# Patient Record
Sex: Female | Born: 1985 | Race: White | Hispanic: No | Marital: Married | Smoking: Never smoker
Health system: Southern US, Community
[De-identification: ages and names within clinical notes are randomized; demographics above are authoritative.]

## PROBLEM LIST (undated history)

## (undated) DIAGNOSIS — N9089 Other specified noninflammatory disorders of vulva and perineum: Secondary | ICD-10-CM

## (undated) DIAGNOSIS — R87619 Unspecified abnormal cytological findings in specimens from cervix uteri: Secondary | ICD-10-CM

## (undated) DIAGNOSIS — IMO0002 Reserved for concepts with insufficient information to code with codable children: Secondary | ICD-10-CM

## (undated) DIAGNOSIS — Z8619 Personal history of other infectious and parasitic diseases: Secondary | ICD-10-CM

## (undated) HISTORY — PX: KNEE SURGERY: SHX244

## (undated) HISTORY — DX: Personal history of other infectious and parasitic diseases: Z86.19

## (undated) HISTORY — DX: Other specified noninflammatory disorders of vulva and perineum: N90.89

## (undated) HISTORY — DX: Reserved for concepts with insufficient information to code with codable children: IMO0002

## (undated) HISTORY — DX: Unspecified abnormal cytological findings in specimens from cervix uteri: R87.619

---

## 2011-04-02 ENCOUNTER — Emergency Department (HOSPITAL_COMMUNITY): Payer: BC Managed Care – PPO

## 2011-04-02 ENCOUNTER — Emergency Department (HOSPITAL_COMMUNITY)
Admission: EM | Admit: 2011-04-02 | Discharge: 2011-04-03 | Disposition: A | Discharge status: Home / Self Care | Payer: BC Managed Care – PPO | Attending: Emergency Medicine | Admitting: Emergency Medicine

## 2011-04-02 DIAGNOSIS — X500XXA Overexertion from strenuous movement or load, initial encounter: Secondary | ICD-10-CM | POA: Insufficient documentation

## 2011-04-02 DIAGNOSIS — M25539 Pain in unspecified wrist: Secondary | ICD-10-CM | POA: Insufficient documentation

## 2011-04-02 DIAGNOSIS — Y9364 Activity, baseball: Secondary | ICD-10-CM | POA: Insufficient documentation

## 2011-04-02 DIAGNOSIS — Y92838 Other recreation area as the place of occurrence of the external cause: Secondary | ICD-10-CM | POA: Insufficient documentation

## 2011-04-02 DIAGNOSIS — Y9239 Other specified sports and athletic area as the place of occurrence of the external cause: Secondary | ICD-10-CM | POA: Insufficient documentation

## 2011-04-02 DIAGNOSIS — S93409A Sprain of unspecified ligament of unspecified ankle, initial encounter: Secondary | ICD-10-CM | POA: Insufficient documentation

## 2012-10-25 ENCOUNTER — Telehealth: Payer: Self-pay | Admitting: Obstetrics and Gynecology

## 2012-10-26 ENCOUNTER — Encounter: Payer: Self-pay | Admitting: Obstetrics and Gynecology

## 2012-10-26 ENCOUNTER — Ambulatory Visit (INDEPENDENT_AMBULATORY_CARE_PROVIDER_SITE_OTHER): Payer: BC Managed Care – PPO | Admitting: Obstetrics and Gynecology

## 2012-10-26 VITALS — BP 110/64 | HR 74 | Resp 14 | Ht 64.5 in | Wt 131.0 lb

## 2012-10-26 DIAGNOSIS — N6459 Other signs and symptoms in breast: Secondary | ICD-10-CM

## 2012-10-26 DIAGNOSIS — N6452 Nipple discharge: Secondary | ICD-10-CM

## 2012-10-26 DIAGNOSIS — Z8619 Personal history of other infectious and parasitic diseases: Secondary | ICD-10-CM | POA: Insufficient documentation

## 2012-10-26 DIAGNOSIS — Z124 Encounter for screening for malignant neoplasm of cervix: Secondary | ICD-10-CM

## 2012-10-26 DIAGNOSIS — Z01419 Encounter for gynecological examination (general) (routine) without abnormal findings: Secondary | ICD-10-CM

## 2012-10-26 DIAGNOSIS — IMO0002 Reserved for concepts with insufficient information to code with codable children: Secondary | ICD-10-CM | POA: Insufficient documentation

## 2012-10-26 DIAGNOSIS — N9089 Other specified noninflammatory disorders of vulva and perineum: Secondary | ICD-10-CM | POA: Insufficient documentation

## 2012-10-26 NOTE — Progress Notes (Signed)
Annual Exam:    Subjective:    Kelsey [name] is a 26 y.o. female, G0P0, who presents for an annual exam.  Last period was a few days late.  Pt has had several episodes of expressable milky substance from right breast only.  Last Pap: 04/21/2011 WNL: ASC-US  Regular Periods:yes last period was 4 days late pt is unsure why Contraception: None  Monthly Breast exam:no Tetanus<35yrs:yes Nl.Bladder Function:yes Daily BMs:yes Healthy Diet:yes Calcium:no Mammogram:no Date of Mammogram: N/A Exercise:yes Have often Exercise: Crossfit 3 days a wek  Seatbelt: yes Abuse at home: no Stressful work:no Sigmoid-colonoscopy: N/A  Bone Density: No PCP: No PCP Change in PMH: No Changes Change in FMH:No Changes    History   Social History  . Marital Status: Single    Spouse Name: N/A    Number of Children: N/A  . Years of Education: N/A   Social History Main Topics  . Smoking status: Never Smoker   . Smokeless tobacco: Never Used  . Alcohol Use: Yes     Comment: socially   . Drug Use: No  . Sexually Active: Yes -- Female partner(s)    Birth Control/ Protection: None   Other Topics Concern  . None   Social History Narrative  . None    Menstrual cycle:   LMP: Patient's last menstrual period was 10/07/2012.           Cycle: usually q28 days.  More recently 3-4 days late.  No IM bleeding.  No cramps  The following portions of the patient's history were reviewed and updated as appropriate: allergies, current medications, past family history, past medical history, past social history, past surgical history and problem list.  Review of Systems Pertinent items are noted in HPI. Breast:Negative for breast lump,nipple discharge or nipple retraction Gastrointestinal: Negative for abdominal pain, change in bowel habits or rectal bleeding Urinary:negative   Objective:    BP 110/64  Pulse 74  Resp 14  Ht 5' 4.5" (1.638 m)  Wt 131 lb (59.421 kg)  BMI 22.14 kg/m2  LMP 10/07/2012     Weight:  Wt Readings from Last 1 Encounters:  10/26/12 131 lb (59.421 kg)          BMI: Body mass index is 22.14 kg/(m^2).  General Appearance: Alert, appropriate appearance for age. No acute distress HEENT: Grossly normal Neck / Thyroid: Supple, no masses, nodes or enlargement Lungs: clear to auscultation bilaterally Back: No CVA tenderness Breast Exam: No masses.  expressable milky discharge from the right. Cardiovascular: Regular rate and rhythm. S1, S2, no murmur Gastrointestinal: Soft, non-tender, no masses or organomegaly Pelvic Exam: Vulva and vagina appear normal. Bimanual exam reveals normal uterus and adnexa. Rectovaginal: normal rectal, no masses Lymphatic Exam: Non-palpable nodes in neck, clavicular, axillary, or inguinal regions Skin: no rash or abnormalities Neurologic: Normal gait and speech, no tremor  Psychiatric: Alert and oriented, appropriate affect.   Wet Prep:not applicable Urinalysis:not applicable UPT: Not done   Assessment:    Mild menstrual irregularity  Expressable right breast galactorrhea Hx HPV   Plan:    pap smear Pt to call in two weeks for breast pap results.  If neg, pt to come in for prolactin, TSH and HGB STD screening: DONE Contraception:NONE.  Will need to consider   Dierdre Forth MD

## 2012-10-28 LAB — PAP IG, CT-NG, RFX HPV ASCU: GC Probe Amp: NEGATIVE

## 2012-10-28 LAB — CYTOLOGY - NON PAP

## 2012-11-01 ENCOUNTER — Ambulatory Visit: Payer: BC Managed Care – PPO | Admitting: Obstetrics and Gynecology

## 2012-11-17 ENCOUNTER — Ambulatory Visit: Payer: BC Managed Care – PPO | Admitting: Obstetrics and Gynecology

## 2012-11-17 ENCOUNTER — Encounter: Payer: Self-pay | Admitting: Obstetrics and Gynecology

## 2012-11-17 VITALS — BP 100/60 | Wt 133.0 lb

## 2012-11-17 DIAGNOSIS — Z8619 Personal history of other infectious and parasitic diseases: Secondary | ICD-10-CM

## 2012-11-17 DIAGNOSIS — IMO0002 Reserved for concepts with insufficient information to code with codable children: Secondary | ICD-10-CM

## 2012-11-17 DIAGNOSIS — N643 Galactorrhea not associated with childbirth: Secondary | ICD-10-CM

## 2012-11-17 DIAGNOSIS — N926 Irregular menstruation, unspecified: Secondary | ICD-10-CM

## 2012-11-17 DIAGNOSIS — R87619 Unspecified abnormal cytological findings in specimens from cervix uteri: Secondary | ICD-10-CM

## 2012-11-17 LAB — HEMOGLOBIN: Hemoglobin: 13.4 g/dL (ref 12.0–15.0)

## 2012-11-17 NOTE — Progress Notes (Signed)
Patient ID: Kelsey [name], female   DOB: [birth year], 27 y.o.   MRN: [id number]  Kelsey [name] is a 27 y.o. female.   HPI: Presents for evaluation of abnormal Pap smear, and followup of expressable right breast discharge.  Pap from breast discharge showed no malignant cells   Indications: Pap smear on December 2013 showed: glandular cell abnormality (AGUS). Previous colposcopy: normal exam without visible pathology  in February 2013. Prior cervical treatment: no treatment.  Past Medical History  Diagnosis Date  . Abnormal pap     ASC-US   . Vulvar lesion   . History of HPV infection     History reviewed. No pertinent past surgical history.  Family History  Problem Relation Age of Onset  . Hyperlipidemia Father   . Hypertension Father   . Cancer Maternal Grandfather   . Arthritis Paternal Grandmother     Social History History  Substance Use Topics  . Smoking status: Never Smoker   . Smokeless tobacco: Never Used  . Alcohol Use: Yes     Comment: socially     No Known Allergies  Current Outpatient Prescriptions  Medication Sig Dispense Refill  . OVER THE COUNTER MEDICATION Pt takes green vibrance        Review of Systems History of the right wrist discharge with a normal breast pap Last menstrual period was slightly later than normal Blood pressure 100/60, weight 133 lb (60.328 kg), last menstrual period 10/31/2012.  Physical Exam: COLPO ONLY  Data Reviewed PRIOR PAP SMEARS AND COLPOS  12/2006  PAP  Nl 03/2008  PAP  ASCUS POSITIVE HR HPV 04/2008  COLPO CIN I, NEG ECC, PERIANAL CONDYLOMATA 05/2008  GARDASIL COMPLETED 11/2008 07/2008  PAP  ASCUS POSITIVE HR HPV 11/2008  PAP  NL 03/2009  PAP  NL 05/2010  PAP  LGSIL 07/2010  COLPO BX=LGSIL  ECC=NOT DONE 10/2010 PAP  LGSIL 04/2011  PAP  ASCUS NEG HR HPV 10/2011 PAP  GLANDULAR ATYPICAL CELLS NOS 12/2011  COLPO NO LESIONS SEEN.  ECC NEG 10/2012 PAP  AGUS FAVOR NEOPLASTIC    Procedure Details  The risks and benefits of the  procedure and Written informed consent obtained.  Speculum placed in vagina and excellent visualization of cervix achieved, cervix swabbed x 3 with acetic acid solution.Marland Kitchen FINDINGS:  4 O'CLOCK ? ABNORMAL VESSELS ECC AND Endometrial biopsy per protocol  Specimens:  Cervical biopsy 4:00.   Endocervical, curettage.   Endometrial biopsy  Complications: none.  Assessment   AGUS, favor neoplastic   Galactorrhea    Irregular menses   Plan  TSH, PROLACTIN, HGB  Pathology and Specimens labelled and sent to Pathology. Return to discuss Pathology results in 2 weeks.      Violanda Bobeck P 11/18/2012, 12:04 PM Previous Pap Smear: 10/26/12  EPITHELIAL CELL ABNORMALITY: GLANDULAR CELLS ATYPICAL GLANDULAR CELLS ARE PRESENT, FAVOR NEOPLASTIC PROCESS (AGC/AGUS).  Previous Colposcopy:  Referred From: n/a LMP: 10/31/12 Contraception: none G,P: 0, 0

## 2012-11-18 LAB — TSH: TSH: 2.356 u[IU]/mL (ref 0.350–4.500)

## 2012-11-26 ENCOUNTER — Telehealth: Payer: Self-pay | Admitting: Obstetrics and Gynecology

## 2012-11-29 ENCOUNTER — Other Ambulatory Visit: Payer: Self-pay

## 2012-11-29 DIAGNOSIS — N926 Irregular menstruation, unspecified: Secondary | ICD-10-CM

## 2012-11-29 NOTE — Telephone Encounter (Signed)
Lm on vm to cb per telephone call.  

## 2012-12-22 ENCOUNTER — Encounter: Payer: Self-pay | Admitting: Obstetrics and Gynecology

## 2012-12-22 ENCOUNTER — Other Ambulatory Visit: Payer: Self-pay | Admitting: Obstetrics and Gynecology

## 2012-12-22 ENCOUNTER — Ambulatory Visit: Payer: BC Managed Care – PPO

## 2012-12-22 ENCOUNTER — Ambulatory Visit: Payer: BC Managed Care – PPO | Admitting: Obstetrics and Gynecology

## 2012-12-22 VITALS — BP 90/56 | Wt 132.0 lb

## 2012-12-22 DIAGNOSIS — N926 Irregular menstruation, unspecified: Secondary | ICD-10-CM

## 2012-12-22 DIAGNOSIS — Z8619 Personal history of other infectious and parasitic diseases: Secondary | ICD-10-CM

## 2012-12-22 DIAGNOSIS — IMO0002 Reserved for concepts with insufficient information to code with codable children: Secondary | ICD-10-CM

## 2012-12-22 NOTE — Progress Notes (Signed)
GYN FOLLOWUP PROBLEM VISIT  HX: Irreg menses AGUS pap galactorrhea  1) blood work is normal (TSH, prolactin) 2) biopsy shows only low grade changes 3) endometrial biopsy is normal     Subjective: Menses fairly regular over last few mos  Objective:  BP 90/56  Wt 132 lb (59.875 kg)  BMI 22.32 kg/m2  LMP 11/28/2012   ULTRASOUND: Uterus: Length: 6.81 cm   Width:  6.65 cm   Height:  5.40 cm  Endo thickness:  14 mm in luteal phase Left ovary:Normal Right ovary:Normal Fibroids:no CDS fluid: no free fluid   Comment: Retroflexed uterus. No uterine masses. Endometrium-no focal lesions identified. Normal cavity shape by 3D images. Left adnexa: Simple, paraovarian cyst seen medial to LTOV. Measures 1.4 cm x 1.1 cm x 0.93 cm   Assessment: AGUS pap with LGSIL of cervix on workup No adnexal masses  Plan:  Return to office 6mos for pap   Dierdre Forth, MD  12/22/2012 5:05 PM

## 2014-11-03 NOTE — L&D Delivery Note (Signed)
Delivery Note At 3:54 AM, on August 16, 2015, a viable female, "Kelsey [name]" was delivered via Vaginal, Spontaneous Delivery (Presentation: Left Occiput Anterior with compound right hand and restitution to LOP). After delivery of head, restitution revealed compound hand, which was grasped by provider allowing for uncomplicated delivery of arm and anterior shoulder. Posterior shoulder and chest then delivered and mother encouraged to grasp infant and bring to abdomen.  Infant with good tone and spontaneous cry. Tactile stimulation given by nurse and infant APGAR: 9, 9.  Cord clamped, cut, and blood collected. Placenta delivered, via massage/expression, after ~35 minutes.  After inspection of placenta, amniotic sac appeared missing and vaginal inspection revealed trailing membranes that was teased out with ring forceps; appeared to be in its entirety.  Vaginal inspection revealed no lacerations.  Fundus firm, at U/-2, and bleeding small.  Mother hemodynamically stable and infant skin to skin prior to provider exit.  Mother desires condoms for birth control method and opts to breastfeed.  Mother also desires to take placenta home for encapsulation and provider states that this is okay, but patient informed that she does have chorioamnionitis and should consult with encapsulation specialist prior to ingesting.  Infant weight at one hour of life: 7lbs 12.9oz  Anesthesia: Epidural  Episiotomy: None Lacerations:  None Suture Repair: None Est. Blood Loss (mL): 680 Placenta: Home with patient-Release signed  Mom to postpartum.  Baby to Couplet care / Skin to Skin.  Gerrald Basu LYNN MSN, CNM 08/16/2015, 5:00 AM

## 2014-12-21 LAB — OB RESULTS CONSOLE GC/CHLAMYDIA
Chlamydia: NEGATIVE
Gonorrhea: NEGATIVE

## 2014-12-21 LAB — OB RESULTS CONSOLE ABO/RH: RH TYPE: POSITIVE

## 2014-12-21 LAB — OB RESULTS CONSOLE ANTIBODY SCREEN: ANTIBODY SCREEN: NEGATIVE

## 2014-12-21 LAB — OB RESULTS CONSOLE HIV ANTIBODY (ROUTINE TESTING): HIV: NONREACTIVE

## 2014-12-21 LAB — OB RESULTS CONSOLE HEPATITIS B SURFACE ANTIGEN: HEP B S AG: NEGATIVE

## 2014-12-21 LAB — OB RESULTS CONSOLE RUBELLA ANTIBODY, IGM: Rubella: IMMUNE

## 2015-05-17 LAB — OB RESULTS CONSOLE RPR: RPR: NONREACTIVE

## 2015-07-25 LAB — OB RESULTS CONSOLE GBS: GBS: NEGATIVE

## 2015-07-31 ENCOUNTER — Inpatient Hospital Stay (HOSPITAL_COMMUNITY): Admission: RE | Admit: 2015-07-31 | Payer: Self-pay

## 2015-08-13 ENCOUNTER — Inpatient Hospital Stay (HOSPITAL_COMMUNITY)
Admission: AD | Admit: 2015-08-13 | Discharge: 2015-08-13 | Disposition: A | Discharge status: Home / Self Care | Payer: BLUE CROSS/BLUE SHIELD | Source: Ambulatory Visit | Attending: Obstetrics and Gynecology | Admitting: Obstetrics and Gynecology

## 2015-08-13 ENCOUNTER — Encounter (HOSPITAL_COMMUNITY): Payer: Self-pay | Admitting: *Deleted

## 2015-08-13 DIAGNOSIS — N856 Intrauterine synechiae: Secondary | ICD-10-CM

## 2015-08-13 DIAGNOSIS — O4290 Premature rupture of membranes, unspecified as to length of time between rupture and onset of labor, unspecified weeks of gestation: Secondary | ICD-10-CM

## 2015-08-13 DIAGNOSIS — Z6832 Body mass index (BMI) 32.0-32.9, adult: Secondary | ICD-10-CM

## 2015-08-13 LAB — POCT FERN TEST
POCT Fern Test: NEGATIVE
POCT Fern Test: POSITIVE

## 2015-08-13 LAB — AMNISURE RUPTURE OF MEMBRANE (ROM) NOT AT ARMC: Amnisure ROM: POSITIVE

## 2015-08-13 NOTE — MAU Provider Note (Signed)
History  Kelsey [name] is a 29 yo G1P0 @ 39.3 wks as dated by an 8.0 wk sono (EDD 08/17/15) presents to MAU w/ c/o leakage of clear fluid since 6:15 PM today (08/13/2015).   Pt called previous on call provider and reported having wet undies after her [employer] training work out, but denied leakage of fluid at the time of her call. She was instructed to observe closely, ambulate, and call back with an update.   Patient called back at 7:10 PM reporting leakage of fluid. She also voiced that her baby became very active after eating a banana. I advised her to come to MAU for a formal evaluation. She desired to come in after she ate dinner. Reports light spotting and new onset of cramping.  Pt arrived to MAU, accompanied by FOB, with birth plan in hand. The plan outlined her wishes for expectant management and no interventions, i.e., IV. Couple's ultimate goal is to undergo a WB delivery. Class attended and consents were obtained. Pt shared w/ both RN and me that if she was indeed ruptured, her plan was to return home and await spontaneous labor. This provider was not aware of couple's plan to leave the hospital if + ROM. Couple states lives within 5 minutes of the hospital and can be relied upon to return to the hospital when appropriate.   Reports GBS neg status.     Patient Active Problem List   Diagnosis Date Noted  . PROM (premature rupture of membranes) 08/13/2015  . Abnormal pap   . Vulvar lesion   . History of HPV infection     No chief complaint on file.  HPI As above OB History    Gravida Para Term Preterm AB TAB SAB Ectopic Multiple Living   1 0              Past Medical History  Diagnosis Date  . Abnormal pap     ASC-US   . Vulvar lesion   . History of HPV infection     Past Surgical History  Procedure Laterality Date  . Knee surgery      Family History  Problem Relation Age of Onset  . Hyperlipidemia Father   . Hypertension Father   . Cancer Maternal Grandfather    . Arthritis Paternal Grandmother     Social History  Substance Use Topics  . Smoking status: Never Smoker   . Smokeless tobacco: Never Used  . Alcohol Use: Yes     Comment: socially not while pregnant    Allergies: No Known Allergies  No prescriptions prior to admission    ROS  +LOF +FM -Ctxs -VB Physical Exam   Results for orders placed or performed during the hospital encounter of 08/13/15 (from the past 24 hour(s))  Amnisure rupture of membrane (rom)not at [facility]     Status: None   Collection Time: 08/13/15  9:00 PM  Result Value Ref Range   Amnisure ROM POSITIVE   Fern Test     Status: None   Collection Time: 08/13/15  9:08 PM  Result Value Ref Range   POCT Fern Test Negative = intact amniotic membranes   Fern Test     Status: None   Collection Time: 08/13/15 10:03 PM  Result Value Ref Range   POCT Fern Test Positive = ruptured amniotic membanes    Blood pressure 125/73, pulse 87, temperature 97.6 F (36.4 C), temperature source Oral, resp. rate 16, height 5\' 4"  (1.626 m), weight 85.276 kg (  188 lb).    Physical Exam Gen: Pleasant, NAD Lungs: CTAB CV: RRR w/o M/R/G Abdomen: gravid, soft, NT, no guarding or rebound Pelvic: Blind swab for fern assessment initially obtained by RN; results neg. SSE subsequently performed by me with +pooling and +valsalva noted. A second fern sample was obtained. Although +valsalva and pooling were sufficient enough to denote ROM, decision was made to also perform AmniSure, with results positive for both Cvx: 1/50/-3, +bloody show Ext: WNL FHRT: Marked variability, +accels, no decels  Ctxs: Few ED Course  Assessment: IUP at 39.3 wks PROM at 6:15 pm on 08/13/2015 -- no concerns for infection at this time  Averse to intervention -- chooses trial of expectant management 1st stage labor GBS neg as of 07/25/2015 Desires WB; attended class and consents signed [place]: Reviewed risks of expectant management to  include 1) fetal demise, 2) infection, 3) cord prolapse, 4) cord compression, 5) abruption, 6) PPH, 7) neonatal sepsis, 8) need for antibiotics before delivery, 9) rapid labor & delivery (at home), and 10) potential for c-section. Couple verbalized understanding of the above risks and desires to press forward w/ their original plan. Informed couple that as my advice was to stay to be induced, signing out AMA was indicated. Implications of signing out AMA were reviewed w/ the couple, who again, verbalized understanding.  Dr. Simona Huh contacted re: couple's wishes and suggested pt be seen in the office tomorrow morning. Will have office contact pt for routine OB appt/f/u. Bleeding/labor precautions. Daily fetal kick counts. Pelvic rest. Advised to immediately report change in color or odor of vaginal discharge.   I spent approximately 20 minutes with this patient with over 50% of time spent in face-to-face counseling.      Farrel Gordon CNM, MS 08/13/2015, 9:21 PM

## 2015-08-13 NOTE — Discharge Instructions (Signed)
Fetal Movement Counts  Patient Name: __________________________________________________ Patient Due Date: ____________________  Performing a fetal movement count is highly recommended in high-risk pregnancies, but it is good for every pregnant woman to do. Your health care provider may ask you to start counting fetal movements at 28 weeks of the pregnancy. Fetal movements often increase:  · After eating a full meal.  · After physical activity.  · After eating or drinking something sweet or cold.  · At rest.  Pay attention to when you feel the baby is most active. This will help you notice a pattern of your baby's sleep and wake cycles and what factors contribute to an increase in fetal movement. It is important to perform a fetal movement count at the same time each day when your baby is normally most active.   HOW TO COUNT FETAL MOVEMENTS  1. Find a quiet and comfortable area to sit or lie down on your left side. Lying on your left side provides the best blood and oxygen circulation to your baby.  2. Write down the day and time on a sheet of paper or in a journal.  3. Start counting kicks, flutters, swishes, rolls, or jabs in a 2-hour period. You should feel at least 10 movements within 2 hours.  4. If you do not feel 10 movements in 2 hours, wait 2-3 hours and count again. Look for a change in the pattern or not enough counts in 2 hours.  SEEK MEDICAL CARE IF:  · You feel less than 10 counts in 2 hours, tried twice.  · There is no movement in over an hour.  · The pattern is changing or taking longer each day to reach 10 counts in 2 hours.  · You feel the baby is not moving as he or she usually does.  Date: ____________ Movements: ____________ Start time: ____________ Finish time: ____________   Date: ____________ Movements: ____________ Start time: ____________ Finish time: ____________  Date: ____________ Movements: ____________ Start time: ____________ Finish time: ____________  Date: ____________ Movements:  ____________ Start time: ____________ Finish time: ____________  Date: ____________ Movements: ____________ Start time: ____________ Finish time: ____________  Date: ____________ Movements: ____________ Start time: ____________ Finish time: ____________  Date: ____________ Movements: ____________ Start time: ____________ Finish time: ____________  Date: ____________ Movements: ____________ Start time: ____________ Finish time: ____________   Date: ____________ Movements: ____________ Start time: ____________ Finish time: ____________  Date: ____________ Movements: ____________ Start time: ____________ Finish time: ____________  Date: ____________ Movements: ____________ Start time: ____________ Finish time: ____________  Date: ____________ Movements: ____________ Start time: ____________ Finish time: ____________  Date: ____________ Movements: ____________ Start time: ____________ Finish time: ____________  Date: ____________ Movements: ____________ Start time: ____________ Finish time: ____________  Date: ____________ Movements: ____________ Start time: ____________ Finish time: ____________   Date: ____________ Movements: ____________ Start time: ____________ Finish time: ____________  Date: ____________ Movements: ____________ Start time: ____________ Finish time: ____________  Date: ____________ Movements: ____________ Start time: ____________ Finish time: ____________  Date: ____________ Movements: ____________ Start time: ____________ Finish time: ____________  Date: ____________ Movements: ____________ Start time: ____________ Finish time: ____________  Date: ____________ Movements: ____________ Start time: ____________ Finish time: ____________  Date: ____________ Movements: ____________ Start time: ____________ Finish time: ____________   Date: ____________ Movements: ____________ Start time: ____________ Finish time: ____________  Date: ____________ Movements: ____________ Start time: ____________ Finish  time: ____________  Date: ____________ Movements: ____________ Start time: ____________ Finish time: ____________  Date: ____________ Movements: ____________ Start time:   ____________ Finish time: ____________  Date: ____________ Movements: ____________ Start time: ____________ Finish time: ____________  Date: ____________ Movements: ____________ Start time: ____________ Finish time: ____________  Date: ____________ Movements: ____________ Start time: ____________ Finish time: ____________   Date: ____________ Movements: ____________ Start time: ____________ Finish time: ____________  Date: ____________ Movements: ____________ Start time: ____________ Finish time: ____________  Date: ____________ Movements: ____________ Start time: ____________ Finish time: ____________  Date: ____________ Movements: ____________ Start time: ____________ Finish time: ____________  Date: ____________ Movements: ____________ Start time: ____________ Finish time: ____________  Date: ____________ Movements: ____________ Start time: ____________ Finish time: ____________  Date: ____________ Movements: ____________ Start time: ____________ Finish time: ____________   Date: ____________ Movements: ____________ Start time: ____________ Finish time: ____________  Date: ____________ Movements: ____________ Start time: ____________ Finish time: ____________  Date: ____________ Movements: ____________ Start time: ____________ Finish time: ____________  Date: ____________ Movements: ____________ Start time: ____________ Finish time: ____________  Date: ____________ Movements: ____________ Start time: ____________ Finish time: ____________  Date: ____________ Movements: ____________ Start time: ____________ Finish time: ____________  Date: ____________ Movements: ____________ Start time: ____________ Finish time: ____________   Date: ____________ Movements: ____________ Start time: ____________ Finish time: ____________  Date: ____________  Movements: ____________ Start time: ____________ Finish time: ____________  Date: ____________ Movements: ____________ Start time: ____________ Finish time: ____________  Date: ____________ Movements: ____________ Start time: ____________ Finish time: ____________  Date: ____________ Movements: ____________ Start time: ____________ Finish time: ____________  Date: ____________ Movements: ____________ Start time: ____________ Finish time: ____________  Date: ____________ Movements: ____________ Start time: ____________ Finish time: ____________   Date: ____________ Movements: ____________ Start time: ____________ Finish time: ____________  Date: ____________ Movements: ____________ Start time: ____________ Finish time: ____________  Date: ____________ Movements: ____________ Start time: ____________ Finish time: ____________  Date: ____________ Movements: ____________ Start time: ____________ Finish time: ____________  Date: ____________ Movements: ____________ Start time: ____________ Finish time: ____________  Date: ____________ Movements: ____________ Start time: ____________ Finish time: ____________     This information is not intended to replace advice given to you by your health care provider. Make sure you discuss any questions you have with your health care provider.     Document Released: 11/19/2006 Document Revised: 11/10/2014 Document Reviewed: 08/16/2012  [employer] Interactive Patient Education ©2016 [employer]

## 2015-08-14 DIAGNOSIS — N856 Intrauterine synechiae: Secondary | ICD-10-CM

## 2015-08-14 DIAGNOSIS — Z6832 Body mass index (BMI) 32.0-32.9, adult: Secondary | ICD-10-CM

## 2015-08-15 ENCOUNTER — Inpatient Hospital Stay (HOSPITAL_COMMUNITY)
Admission: AD | Admit: 2015-08-15 | Discharge: 2015-08-18 | DRG: 775 | Disposition: A | Discharge status: Home / Self Care | Payer: BLUE CROSS/BLUE SHIELD | Source: Ambulatory Visit | Attending: Obstetrics and Gynecology | Admitting: Obstetrics and Gynecology

## 2015-08-15 ENCOUNTER — Encounter (HOSPITAL_COMMUNITY): Payer: Self-pay

## 2015-08-15 DIAGNOSIS — O4292 Full-term premature rupture of membranes, unspecified as to length of time between rupture and onset of labor: Principal | ICD-10-CM | POA: Diagnosis present

## 2015-08-15 DIAGNOSIS — O411230 Chorioamnionitis, third trimester, not applicable or unspecified: Secondary | ICD-10-CM | POA: Diagnosis present

## 2015-08-15 DIAGNOSIS — O4290 Premature rupture of membranes, unspecified as to length of time between rupture and onset of labor, unspecified weeks of gestation: Secondary | ICD-10-CM | POA: Diagnosis present

## 2015-08-15 DIAGNOSIS — Z8249 Family history of ischemic heart disease and other diseases of the circulatory system: Secondary | ICD-10-CM | POA: Diagnosis not present

## 2015-08-15 DIAGNOSIS — Z3A39 39 weeks gestation of pregnancy: Secondary | ICD-10-CM

## 2015-08-15 DIAGNOSIS — O34533 Maternal care for retroversion of gravid uterus, third trimester: Secondary | ICD-10-CM | POA: Diagnosis present

## 2015-08-15 DIAGNOSIS — Z8261 Family history of arthritis: Secondary | ICD-10-CM | POA: Diagnosis not present

## 2015-08-15 LAB — CBC
HCT: 37.1 % (ref 36.0–46.0)
Hemoglobin: 12.8 g/dL (ref 12.0–15.0)
MCH: 30.6 pg (ref 26.0–34.0)
MCHC: 34.5 g/dL (ref 30.0–36.0)
MCV: 88.8 fL (ref 78.0–100.0)
PLATELETS: 157 10*3/uL (ref 150–400)
RBC: 4.18 MIL/uL (ref 3.87–5.11)
RDW: 13.8 % (ref 11.5–15.5)
WBC: 22.2 10*3/uL — ABNORMAL HIGH (ref 4.0–10.5)

## 2015-08-15 LAB — RAPID HIV SCREEN (HIV 1/2 AB+AG)
HIV 1/2 Antibodies: NONREACTIVE
HIV-1 P24 ANTIGEN - HIV24: NONREACTIVE

## 2015-08-15 LAB — TYPE AND SCREEN
ABO/RH(D): B POS
Antibody Screen: NEGATIVE

## 2015-08-15 LAB — RPR: RPR: NONREACTIVE

## 2015-08-15 LAB — ABO/RH: ABO/RH(D): B POS

## 2015-08-15 MED ORDER — LIDOCAINE HCL (PF) 1 % IJ SOLN
30.0000 mL | INTRAMUSCULAR | Status: DC | PRN
  Filled 2015-08-15: qty 30

## 2015-08-15 MED ORDER — LACTATED RINGERS IV SOLN
500.0000 mL | INTRAVENOUS | Status: DC | PRN
  Administered 2015-08-15: 500 mL via INTRAVENOUS
  Administered 2015-08-15: 1000 mL via INTRAVENOUS

## 2015-08-15 MED ORDER — PHENYLEPHRINE 40 MCG/ML (10ML) SYRINGE FOR IV PUSH (FOR BLOOD PRESSURE SUPPORT)
80.0000 ug | PREFILLED_SYRINGE | INTRAVENOUS | Status: DC | PRN
  Filled 2015-08-15: qty 20

## 2015-08-15 MED ORDER — OXYCODONE-ACETAMINOPHEN 5-325 MG PO TABS
1.0000 | ORAL_TABLET | ORAL | Status: DC | PRN
Start: 2015-08-15 — End: 2015-08-16

## 2015-08-15 MED ORDER — FENTANYL CITRATE (PF) 100 MCG/2ML IJ SOLN
100.0000 ug | INTRAMUSCULAR | Status: DC | PRN
  Administered 2015-08-15: 100 ug via INTRAVENOUS
  Filled 2015-08-15: qty 2

## 2015-08-15 MED ORDER — TERBUTALINE SULFATE 1 MG/ML IJ SOLN: 0.2500 mg | Freq: Once | INTRAMUSCULAR | Status: DC | PRN

## 2015-08-15 MED ORDER — OXYTOCIN BOLUS FROM INFUSION
500.0000 mL | INTRAVENOUS | Status: DC
Start: 2015-08-15 — End: 2015-08-16
  Administered 2015-08-16: 500 mL via INTRAVENOUS

## 2015-08-15 MED ORDER — SODIUM CHLORIDE 0.9 % IV SOLN: 250.0000 mL | INTRAVENOUS | Status: DC | PRN

## 2015-08-15 MED ORDER — OXYTOCIN 40 UNITS IN LACTATED RINGERS INFUSION - SIMPLE MED
62.5000 mL/h | INTRAVENOUS | Status: DC
Start: 2015-08-15 — End: 2015-08-16

## 2015-08-15 MED ORDER — OXYCODONE-ACETAMINOPHEN 5-325 MG PO TABS: 2.0000 | ORAL_TABLET | ORAL | Status: DC | PRN

## 2015-08-15 MED ORDER — SODIUM CHLORIDE 0.9 % IV SOLN
3.0000 g | Freq: Four times a day (QID) | INTRAVENOUS | Status: AC
  Administered 2015-08-15 – 2015-08-16 (×4): 3 g via INTRAVENOUS
  Filled 2015-08-15 (×4): qty 3

## 2015-08-15 MED ORDER — FENTANYL 2.5 MCG/ML BUPIVACAINE 1/10 % EPIDURAL INFUSION (WH - ANES)
14.0000 mL/h | INTRAMUSCULAR | Status: DC | PRN
  Administered 2015-08-16: 14 mL/h via EPIDURAL
  Filled 2015-08-15: qty 125

## 2015-08-15 MED ORDER — EPHEDRINE 5 MG/ML INJ: 10.0000 mg | INTRAVENOUS | Status: DC | PRN

## 2015-08-15 MED ORDER — FENTANYL CITRATE (PF) 100 MCG/2ML IJ SOLN: 50.0000 ug | INTRAMUSCULAR | Status: DC | PRN

## 2015-08-15 MED ORDER — OXYTOCIN 40 UNITS IN LACTATED RINGERS INFUSION - SIMPLE MED
1.0000 m[IU]/min | INTRAVENOUS | Status: DC
  Administered 2015-08-15: 1 m[IU]/min via INTRAVENOUS
  Administered 2015-08-16: 2 m[IU]/min via INTRAVENOUS
  Filled 2015-08-15: qty 1000

## 2015-08-15 MED ORDER — CITRIC ACID-SODIUM CITRATE 334-500 MG/5ML PO SOLN: 30.0000 mL | ORAL | Status: DC | PRN

## 2015-08-15 MED ORDER — DIPHENHYDRAMINE HCL 50 MG/ML IJ SOLN: 12.5000 mg | INTRAMUSCULAR | Status: DC | PRN

## 2015-08-15 MED ORDER — LACTATED RINGERS IV SOLN
INTRAVENOUS | Status: DC
  Administered 2015-08-15: via INTRAVENOUS

## 2015-08-15 MED ORDER — ACETAMINOPHEN 325 MG PO TABS: 650.0000 mg | ORAL_TABLET | ORAL | Status: DC | PRN

## 2015-08-15 MED ORDER — ONDANSETRON HCL 4 MG/2ML IJ SOLN: 4.0000 mg | Freq: Four times a day (QID) | INTRAMUSCULAR | Status: DC | PRN

## 2015-08-15 NOTE — H&P (Signed)
Kelsey [name] is a 29 y.o. female, G1P0 at 39.6 weeks, presenting for contractions that have increased in frequency and intensity.  Patient also experienced PROM on 08/13/15 since 1815, clear fluid. Contractions remain irregular, but patient have become longer in duration since 2300. Seen in MAU on 08/13/15 for ROM assessment with positive work up, however pt refused induction, and signed out AMA due to her desire for expectant management/Waterbirth delivery. WB class attended on 03/21/15, with consents obtained on 07/11/15. Had f/u appointment in office on 08/14/15 due to diagnosis. She reports +FM and notes some bloody show. She is accompanied by the FOB, her mother and sister.  Patient Active Problem List   Diagnosis Date Noted  . Prolonged spontaneous rupture of membranes 08/15/2015  . BMI 32.0-32.9,adult 08/14/2015  . 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 PROM (premature rupture of membranes) 08/13/2015  . Abnormal pap   . Vulvar lesion   . History of HPV infection     History of present pregnancy: Patient entered care at 8 weeks.   EDC of 08/17/15 was established by 8-wk (dating) sono. LMP unknown. Anatomy scan: 18.6 weeks, with placenta previa (0.82 cm). Small synechiae in the posterior RU quadrant. Anatomy seen and o/w appears normal. F/U cardiac, profile and NB. Pelvic rest recommended and bleeding precautions reviewed.   Additional [place] evaluations:  8.0 wks (dating due to unknown LMP): Retroverted uterus. YS and amnion seen. Ovaries and adnexas WNLs. 23 wks (f/u anatomy): EFW 528 g, linear growth, placenta previa remains at 0.82 cm from os. Pelvic rest/bleeding precautions reiterated. Posterior placenta. Cardiac views seen. Profile and NB not well visualized. F/U between 30-34 wks. Uterine synechiae again noted.  32.5 wks: (f/u previa, growth): Normal growth. EFW 4lbs 8 oz +/-11 (2055 g); 43rd percentile. Placenta now 3.0 cm away from cvx. Normal fluid. No longer with a previa. Dr. Charlesetta Garibaldi  recommended repeat u/s at 36 wks.  Significant prenatal events: Planned pregnancy w/ fiance Narada. Normal first trimester pregnancy complaints -- comfort measures reviewed. Normal second trimester pregnancy complaints - comfort measures reviewed. Declined genetic testing. Uterine synechiae noted on anatomy scan. Placenta previa w/o hemorrhage (onset 03/22/15 w/ resolution on 06/27/15; pt declined further scans due to cost despite MD recs.) 3rd trimester pregnancy complaints  -- comfort measures reviewed. Desires WB and attended class on 03/21/15. Consents obtained on 07/11/15. Declined Tdap and flu vaccine. 45-lb TWG. Last evaluation: MAU on 08/13/15 to r/o rupture - diagnosis confirmed -- had +pooling, +valsalva, +ferning and +AmniSure. Refused induction and signed out AMA with plans to return when labor ensues.  OB History    Gravida Para Term Preterm AB TAB SAB Ectopic Multiple Living   1 0             Past Medical History  Diagnosis Date  . Abnormal pap     ASC-US   . Vulvar lesion   . History of HPV infection    Past Surgical History  Procedure Laterality Date  . Knee surgery     Family History: family history includes Arthritis in her paternal grandmother; Cancer in her maternal grandfather; Hyperlipidemia in her father; Hypertension in her father.Dark stools in her father and Arthritis in her PGM. Social History:  reports that she has never smoked. She has never used smokeless tobacco. She reports that she drinks alcohol. She reports that she does not use illicit drugs. Patient is single, with FOB/fiance Alvester Morin Avey) involved and supportive.She is Caucasian in ethnicity, has no religious preference and is currently  employed. She will accept blood in an emergency.    Prenatal Transfer Tool  Maternal Diabetes: No Genetic Screening: Declined Maternal Ultrasounds/Referrals: Abnormal:  Findings:   Other: Uterine Synechiae Fetal Ultrasounds or other Referrals:  None Maternal Substance  Abuse:  No Significant Maternal Medications:  Meds include: Other: PNVs Significant Maternal Lab Results: Lab values include: Group B Strep negative  TDAP: NA Flu: NA  Completed Gardasil series in 2009/2010  ROS: 10+ point review of systems negative except as detailed in HPI.    No Known Allergies     There were no vitals taken for this visit.  Chest clear Heart RRR without murmur Abd gravid, NT, FH CWD Pelvic: 2/90/-3 Ext: WNL  FHR: Category I FT, 130 bpm, Mod Var, -Decels, +Accels UCs:  Irregular, Q 3-75min, palpates mild to moderate  Prenatal labs: ABO, Rh: B+ (12/21/14) Antibody: Neg (12/21/14) Rubella: Immune (12/21/14) RPR: NR (12/21/14)  HBsAg: Neg (12/21/14) HIV: Neg (12/21/14 & 05/17/15) GBS: Neg (07/25/15) Sickle cell/Hgb electrophoresis: NA Pap: LGSIL (08/17/14) GC: Neg (12/21/14) Chlamydia: Neg (12/21/14) Genetic screenings: Declined Glucola: Normal at 84 Other: Urine culture -- insignificant growth (12/21/14) Hgb 14.5 at NOB, 12.5 at 28 weeks    Assessment: IUP at 39.5 wks Cat I FT Prolonged SROM x 33 hrs  Early Labor GBS neg  BMI 32.4 -- 45-lb TWG this pregnancy Uterine synechiae  Plan: Admit to [facility] orders. Expectant management for now. Couple informed ok for WB as long as continues to meet criteria -- i.e. no evidence of infection. Discussed increased risk of infection as time passes without progression of labor. See previous note re: review of risks associated w/ expectant management of labor with PROM. Copy of birth plan received/reviewed. Couple will address Hep B Vaccine with their Pediatrician of choice, and plan to refuse eye ointment and Vit K for newborn (will be using own oral Vitamin K). Encouraged to get saline lock if need for emergent IV access necessary--i.e. PPH, antibiotic therapy, fetal distress Okay for intermittent monitoring; 1) auscultate FHR q 30 min in active labor, 2) q 15 min in transition, 3) q 5 min  with pushing, and 4) possibly have pt ambulate without monitoring. If in early labor or < 4cm, will consider NST q 2 hours. Dr. Sophronia Simas to updated as appropriate     Kandis Fantasia, MSN 08/15/2015, 2:48 AM

## 2015-08-15 NOTE — Progress Notes (Signed)
Kelsey [name] MRN: [id number]  Subjective: -Patient breathing through contractions, but displays some signs of distress.  Requests cervical exam to note progress.  FOB and family at bedside.    Objective: BP 118/70 mmHg  Pulse 80  Temp(Src) 99.1 F (37.3 C) (Axillary)  Resp 22  Ht 5\' 4"  (1.626 m)  Wt 85.276 kg (188 lb)  BMI 32.25 kg/m2     FHT: 135 bpm, Mod Var, -Decels, +Accels UC: Palpates mild   SVE:   Dilation: 2.5 Effacement (%): 90 Station: -2 Exam by:: Kelsey [name],cnm Membranes: SROM x 36hrs Pitocin:None  Assessment:  IUP at 39.5wks Cat I FT  Prolonged ROM GBS Negative Elevated WBCs Risk for PP Endometritis  Plan: -Discussed chorioamnionitis including risk to fetus and patient -Informed of elevated WBCs and how if temperature continues to rise, WB not an option -Dr. Sophronia [name] updated on patient status and recommend antibiotics for PP endometritis prophylaxis -In room to make recommendation, discussed r/b of endometritis including fever, chills, fundal tenderness, and subinvolution resulting in increased bleeding and need for hysterectomy -Give Unasyn 3g Q 6hrs if patient agreeable -Questions and concerns addressed -Patient and FOB reassured that provider and physician goal is for healthy mom and healthy baby -Patient requests time to discuss with family -Start NST Q 1hr  -Continue other mgmt as ordered -Report given to V.Standard, CNM   Kelsey Thielen LYNN,MSN, CNM 08/15/2015, 6:24 AM

## 2015-08-15 NOTE — Progress Notes (Signed)
Kelsey [name] MRN: [id number]  Subjective: -Patient resting in bed.  Patient is tearful when reporting fatigue and frustration regarding labor process.  Patient requests cervical check and is considering pitocin for augmentation. FOB and mother at bedside.   Objective: BP 113/64 mmHg  Pulse 94  Temp(Src) 99.6 F (37.6 C) (Oral)  Resp 20  Ht 5\' 4"  (1.626 m)  Wt 85.276 kg (188 lb)  BMI 32.25 kg/m2     FHT: 145 bpm, Mod Var, -Decels, +Accels UC: Q4-6min, palpates mild   SVE:   Dilation: 4.5 Effacement (%): 90 Station: -1 Exam by:: Arlen Legendre,cnm Membranes: SROM x 50 hrs Pitocin: Initiated  Assessment:  IUP at 39.5wks Cat I FT  Prolonged ROM Elevated WBC Afebrile Labor Augmentation  Plan: -Educated regarding pitocin as augmentation method including how it naturally occurs in the body, its actions on receptors, and goal of regular uterine contractions -Informed of r/b including tachysystole, fetal intolerance, and inability to cope resulting in need for pain mgmt -Questions and concerns addressed -Patient encouraged to consider how long she want to be in labor and if she is willing to potentially do this for another 12, 24, or 48 hrs.  Patient reports wanting to be done now, but fears pitocin usage -Fears addressed, reassurances given -Informed that potential for pitocin to be discontinued at complete dilation to allow for birth in the water if contractions remain effective and adequate and FHT remains Cat I -Start pitocin now at 1x1 -Clear liquid diet -Continuous fetal monitoring -Will return for VE in 4 hours -Continue other mgmt as ordered  [name],MSN, CNM 08/15/2015, 8:27 PM

## 2015-08-15 NOTE — Progress Notes (Signed)
Labor Progress  Subjective: Pt understands she has a possibility of risking out of a water birth Family and FOB at the bedside Discussed pitocin to augment labor to minimize the change of chorio Discussed the chances of the baby going to and staying in NICU d/t chorio Pt does not want VE often to decrease risk of infection C/O back pain with each ctx  Objective: BP 117/58 mmHg  Pulse 80  Temp(Src) 98 F (36.7 C) (Oral)  Resp 18  Ht 5\' 4"  (1.626 m)  Wt 188 lb (85.276 kg)  BMI 32.25 kg/m2     FHT: 135, moderate variability, + accel, no decel CTX:  regular, every 4 minutes Uterus gravid, soft non tender SVE:  Dilation: 3.5 Effacement (%): 90 Station: -1 Exam by:: Leomar Westberg, CNM  Assessment:  IUP at 39.5 weeks NICHD: Category 1 Membranes:  SROM x 36+hrs, with s/s of infection, WBC Labor progress:early labor Pitocin Augmentation GBS: negative Unasyn NST q1   Plan: Continue labor plan intermittent monitoring Desire water birth Frequent position changes to facilitate fetal rotation and descent. Will reassess with cervical exam at 1200 or earlier if necessary Encouraged hands and knees to relieve back pressure and to encourage optimal fetal position.      Olena Willy, CNM, MSN 08/15/2015. 8:57 AM

## 2015-08-15 NOTE — Progress Notes (Signed)
Addendum  I returned for the schedule labor progress eval but the pt is not in the room.

## 2015-08-15 NOTE — Progress Notes (Signed)
Labor Progress  Subjective: Pt states she loss trace of time.  The risk of infection was once again reviewed and starting pitocin was once again refused.    Objective: BP 122/70 mmHg  Pulse 91  Temp(Src) 99.5 F (37.5 C) (Oral)  Resp 18  Ht 5\' 4"  (1.626 m)  Wt 188 lb (85.276 kg)  BMI 32.25 kg/m2     FHT: 135, moderate variability + accel, variable  decel x1 CTX:  regular, every 4-5 minutes Uterus gravid, soft non tender SVE:  Dilation: 3.5 Effacement (%): 90 Station: -1, 0 Exam by:: Kecia Swoboda, cnm   Assessment:  IUP at 39.5 weeks NICHD: Category 1 Membranes:  SROM x 46hrs, elevated WBC, no fever Labor progress: Inadquate labor GBS: negative Pt refusing pitocin   Plan: Continue labor plan intermittent monitoring Ambulate Will reassess with cervical exam at 1830 or earlier if necessary      Tajah Noguchi, CNM, MSN 08/15/2015. 4:06 PM

## 2015-08-15 NOTE — Progress Notes (Signed)
Kelsey [name] MRN: [id number]  Subjective: -Patient requests cervical exam stating pain is becoming unbearable.  Provider in room to assess.  Patient tearful and states "I feel like shit, this contractions feel like shit."  Patient questions options for pain mgmt and expresses some desire to get in waterbirth tub.  FOB at bedside attempting to encourage present mgmt despite patient objections.  Patient mother at bedside supportive.   Objective: BP 125/68 mmHg  Pulse 72  Temp(Src) 98.6 F (37 C) (Oral)  Resp 20  Ht 5\' 4"  (1.626 m)  Wt 85.276 kg (188 lb)  BMI 32.25 kg/m2     FHT: 135 bpm, Mod Var, -Decels, +Accels UC: Q1-24min, palpates strong   SVE:   Dilation: 5 Effacement (%): 100 Station: -1 Exam by:: Eileen Croswell,cnm Membranes: SROM x 53hrs Pitocin:3mUn/min  Assessment:  IUP at 39.5wks Cat I FT  Labor Augmentation Prolonged ROM Afebrile  Plan: -Discussed vaginal exam and inability to get in waterbirth tub at current.  Patient informed that at current pitocin rate, minimal cervical change has occurred and pitocin still necessary. -Patient requests epidural and informed that IV pain medication is available  -Discussed IV fentanyl and epidural availability, at length, as well as benefits of each method -Patient asking specific questions regarding epidural placement--provider informed patient that unable to answer these questions due to lack of knowledge regarding risk and would get anesthesia to consult -Dr. Jillyn Hidden notified and agrees to discuss with patient -Patient requests IV pain medication  -Continue other mgmt as ordered  [facility], Jasaun Carn LYNN,MSN, CNM 08/15/2015, 10:57 PM

## 2015-08-15 NOTE — Progress Notes (Signed)
Pt refuses saline lock at this time.

## 2015-08-15 NOTE — Progress Notes (Addendum)
Kelsey [name] is a 29 y.o. G2P0 at [redacted]w[redacted]d admitted for prolonged premature rupture of membranes  Subjective: Patient feels contractions are getting stronger and more frequent.   She desires to continue expectant management and be re-evaluated in about 1 hour.  She states she does not want labor induction as she prefers natural progress of labor.  We discussed medical intervention such as pitocin is safe in pregnancy, but she would not be able to have a water birth if pitocin given due to need for continuous monitoring.  Patient declines labor induction for now. Agrees to continue with prophylactic antibiotics.    Objective: BP 133/65 mmHg  Pulse 71  Temp(Src) 99.5 F (37.5 C) (Oral)  Resp 18  Ht 5\' 4"  (1.626 m)  Wt 85.276 kg (188 lb)  BMI 32.25 kg/m2      FHT:  Normal baseline, moderate variability, reactive. UC:   Q 3- 4 minutes SVE:   Dilation: 3.5 Effacement (%): 90 Station: -1, 0 Exam by:: Venus Standard, cnm  Labs: Lab Results  Component Value Date   WBC 22.2* 08/15/2015   HGB 12.8 08/15/2015   HCT 37.1 08/15/2015   MCV 88.8 08/15/2015   PLT 157 08/15/2015    Assessment / Plan: Prolonged rupture of membranes at term, desiring continued expectant management, desiring a water birth,48 hours since rupture of membranes,   I discussed in detail with patient risks of continued expectant management including risks of maternal and fetal infection,chorioamnionitis and its accompanying comorbidities such as neonatal compromise, postpartum hemorrhage.  I advised labor induction now with pitocin but patient desires to wait a little longer first.  She states after cervical exam in a fw minutes she will have a better idea.  She also desires to continue with intermittent monitoring so she can remain active walking, accepting all the risks of not continuously monitoring the fetus.   Discussion with patient, father of baby and patient's mother in the room.     -Check CBC 24 hrs from last  draw. -Reasses at about 7.30 pm.   Waymon Amato [facility] 08/15/2015, 7:11 PM

## 2015-08-15 NOTE — Progress Notes (Signed)
Labor Progress  Subjective: Pt report occasional ctx, occasionally stronger ctx while ambulating in the hall.  Risk of prolong rupture discussed extensively to the pt, the fob/husband, her mother and sister.  The risk include infection to her and her unborn child that could result in severe complication including but not limited a prolong NICU stay and/or fetal death.  Pt informed that she has not changed her cervix, she has been rupture for almost 43+ hours and it is our professional recommendation that we begin pitocin to augment labor.  She is refusing pitocin at this time.  The pt has request not return until 1500 to revaluate the situation.  She aware that her decision is against medical advise.  Total time spent explaining the risk to her and her family was 30+ minutes.    Objective: BP 130/68 mmHg  Pulse 97  Temp(Src) 99.6 F (37.6 C) (Oral)  Resp 18  Ht 5\' 4"  (1.626 m)  Wt 188 lb (85.276 kg)  BMI 32.25 kg/m2     FHT: 130, moderate variability, + accel, no decels CTX:  occasional Uterus gravid, soft non tender SVE:  Dilation: 3.5 Effacement (%): 90 Station: -1 Exam by:: Kelsey [name], CNM   Assessment:  IUP at 39.5 weeks NICHD: Category Membranes:  SROM x 42hrs, elevated WBCs at 22.2.  No fever at this time Labor progress: Inadquate labor progress GBS: negative  Plan: Continue labor plan intermittent monitoring Rest/Ambulate Will reassess with cervical exam at 1500 or earlier if necessary Pt refusing augmentation Dr Kelsey [name] informed     Kelsey [name], CNM, MSN 08/15/2015. 1:06 PM

## 2015-08-16 ENCOUNTER — Encounter (HOSPITAL_COMMUNITY): Payer: Self-pay | Admitting: Anesthesiology

## 2015-08-16 ENCOUNTER — Inpatient Hospital Stay (HOSPITAL_COMMUNITY): Payer: BLUE CROSS/BLUE SHIELD | Admitting: Anesthesiology

## 2015-08-16 DIAGNOSIS — O411230 Chorioamnionitis, third trimester, not applicable or unspecified: Secondary | ICD-10-CM | POA: Diagnosis present

## 2015-08-16 LAB — CBC WITH DIFFERENTIAL/PLATELET
BASOS ABS: 0 10*3/uL (ref 0.0–0.1)
Basophils Relative: 0 %
EOS PCT: 0 %
Eosinophils Absolute: 0 10*3/uL (ref 0.0–0.7)
HEMATOCRIT: 37.3 % (ref 36.0–46.0)
Hemoglobin: 12.7 g/dL (ref 12.0–15.0)
LYMPHS ABS: 0.8 10*3/uL (ref 0.7–4.0)
LYMPHS PCT: 4 %
MCH: 30.6 pg (ref 26.0–34.0)
MCHC: 34 g/dL (ref 30.0–36.0)
MCV: 89.9 fL (ref 78.0–100.0)
MONO ABS: 1 10*3/uL (ref 0.1–1.0)
MONOS PCT: 4 %
NEUTROS ABS: 21.2 10*3/uL — AB (ref 1.7–7.7)
Neutrophils Relative %: 92 %
PLATELETS: 158 10*3/uL (ref 150–400)
RBC: 4.15 MIL/uL (ref 3.87–5.11)
RDW: 13.8 % (ref 11.5–15.5)
WBC: 23.1 10*3/uL — ABNORMAL HIGH (ref 4.0–10.5)

## 2015-08-16 MED ORDER — TETANUS-DIPHTH-ACELL PERTUSSIS 5-2.5-18.5 LF-MCG/0.5 IM SUSP
0.5000 mL | Freq: Once | INTRAMUSCULAR | Status: DC
Start: 2015-08-17 — End: 2015-08-17

## 2015-08-16 MED ORDER — WITCH HAZEL-GLYCERIN EX PADS: 1.0000 "application " | MEDICATED_PAD | CUTANEOUS | Status: DC | PRN

## 2015-08-16 MED ORDER — PRENATAL MULTIVITAMIN CH
1.0000 | ORAL_TABLET | Freq: Every day | ORAL | Status: DC
  Filled 2015-08-16: qty 1

## 2015-08-16 MED ORDER — LANOLIN HYDROUS EX OINT: TOPICAL_OINTMENT | CUTANEOUS | Status: DC | PRN

## 2015-08-16 MED ORDER — BENZOCAINE-MENTHOL 20-0.5 % EX AERO: 1.0000 "application " | INHALATION_SPRAY | CUTANEOUS | Status: DC | PRN

## 2015-08-16 MED ORDER — DIBUCAINE 1 % RE OINT: 1.0000 "application " | TOPICAL_OINTMENT | RECTAL | Status: DC | PRN

## 2015-08-16 MED ORDER — SIMETHICONE 80 MG PO CHEW: 80.0000 mg | CHEWABLE_TABLET | ORAL | Status: DC | PRN

## 2015-08-16 MED ORDER — ACETAMINOPHEN 325 MG PO TABS: 650.0000 mg | ORAL_TABLET | ORAL | Status: DC | PRN

## 2015-08-16 MED ORDER — OXYCODONE-ACETAMINOPHEN 5-325 MG PO TABS: 1.0000 | ORAL_TABLET | ORAL | Status: DC | PRN

## 2015-08-16 MED ORDER — LIDOCAINE HCL (PF) 1 % IJ SOLN
INTRAMUSCULAR | Status: DC | PRN
  Administered 2015-08-16 (×2): 8 mL via EPIDURAL

## 2015-08-16 MED ORDER — ONDANSETRON HCL 4 MG/2ML IJ SOLN: 4.0000 mg | INTRAMUSCULAR | Status: DC | PRN

## 2015-08-16 MED ORDER — AMPICILLIN-SULBACTAM SODIUM 3 (2-1) G IJ SOLR
3.0000 g | Freq: Four times a day (QID) | INTRAMUSCULAR | Status: DC
  Administered 2015-08-16 (×2): 3 g via INTRAVENOUS
  Filled 2015-08-16 (×4): qty 3

## 2015-08-16 MED ORDER — DIPHENHYDRAMINE HCL 25 MG PO CAPS: 25.0000 mg | ORAL_CAPSULE | Freq: Four times a day (QID) | ORAL | Status: DC | PRN

## 2015-08-16 MED ORDER — OXYCODONE-ACETAMINOPHEN 5-325 MG PO TABS: 2.0000 | ORAL_TABLET | ORAL | Status: DC | PRN

## 2015-08-16 MED ORDER — ONDANSETRON HCL 4 MG PO TABS: 4.0000 mg | ORAL_TABLET | ORAL | Status: DC | PRN

## 2015-08-16 MED ORDER — SENNOSIDES-DOCUSATE SODIUM 8.6-50 MG PO TABS
2.0000 | ORAL_TABLET | ORAL | Status: DC
  Filled 2015-08-16: qty 2

## 2015-08-16 MED ORDER — ZOLPIDEM TARTRATE 5 MG PO TABS: 5.0000 mg | ORAL_TABLET | Freq: Every evening | ORAL | Status: DC | PRN

## 2015-08-16 MED ORDER — IBUPROFEN 600 MG PO TABS
600.0000 mg | ORAL_TABLET | Freq: Four times a day (QID) | ORAL | Status: DC
  Filled 2015-08-16 (×4): qty 1

## 2015-08-16 NOTE — Progress Notes (Signed)
Patient's IV access infiltrated. Georgie Chard, CNM notified and ordered to discontinue unasyn.

## 2015-08-16 NOTE — Anesthesia Postprocedure Evaluation (Signed)
  Anesthesia Post-op Note  Patient: Kelsey [name]  Procedure(s) Performed: * No procedures listed *  Patient Location: Mother/Baby  Anesthesia Type:Epidural  Level of Consciousness: awake, alert , oriented and patient cooperative  Airway and Oxygen Therapy: Patient Spontanous Breathing  Post-op Pain: none  Post-op Assessment: Post-op Vital signs reviewed, Patient's Cardiovascular Status Stable, Respiratory Function Stable, Patent Airway, No headache, No backache and Patient able to bend at knees              Post-op Vital Signs: Reviewed and stable  Last Vitals:  Filed Vitals:   08/16/15 0643  BP: 96/65  Pulse: 84  Temp: 37.2 C  Resp: 18    Complications: No apparent anesthesia complications

## 2015-08-16 NOTE — Progress Notes (Signed)
Kelsey [name] MRN: [id number]  Subjective: -0200: Nurse call to state patient comfortable s/p epidural.  Foley catheter placed and now C/C/ +1.  Patient reports urge to push.  In room to assess.  Actively pushing.  Fetus at +3.    Objective: BP 117/61 mmHg  Pulse 102  Temp(Src) 100.1 F (37.8 C) (Axillary)  Resp 18  Ht 5\' 4"  (1.626 m)  Wt 85.276 kg (188 lb)  BMI 32.25 kg/m2  SpO2 99%      Results for orders placed or performed during the hospital encounter of 08/15/15 (from the past 24 hour(s))  CBC with Differential/Platelet     Status: Abnormal (Preliminary result)   Collection Time: 08/16/15  3:10 AM  Result Value Ref Range   WBC 23.1 (H) 4.0 - 10.5 K/uL   RBC 4.15 3.87 - 5.11 MIL/uL   Hemoglobin 12.7 12.0 - 15.0 g/dL   HCT 37.3 36.0 - 46.0 %   MCV 89.9 78.0 - 100.0 fL   MCH 30.6 26.0 - 34.0 pg   MCHC 34.0 30.0 - 36.0 g/dL   RDW 13.8 11.5 - 15.5 %   Platelets 158 150 - 400 K/uL   Neutrophils Relative % 92 %   Neutro Abs 21.2 (H) 1.7 - 7.7 K/uL   Lymphocytes Relative 4 %   Lymphs Abs 0.8 0.7 - 4.0 K/uL   Monocytes Relative 4 %   Monocytes Absolute 1.0 0.1 - 1.0 K/uL   Eosinophils Relative 0 %   Eosinophils Absolute 0.0 0.0 - 0.7 K/uL   Basophils Relative 0 %   Basophils Absolute 0.0 0.0 - 0.1 K/uL   Other PENDING %    FHT: 145 bpm, Mod Var, +Variable Decels with pushing but recovers well, +Accels UC: Unable to graph, palpates strong   SVE:   Dilation: 10 Effacement (%): 100 Station: +1 Exam by:: cwicker,rnc Membranes: SROM x 57hrs Pitocin:66mUn/min  Assessment:  IUP at 39.6wks Cat II FT  2nd Stage Labor Fever Elevated WBC Suspected Chorioamnionitis   Plan: -Continue unasyn x 24 hours -Continue other mgmt as ordered -Anticipate SVD  Doneisha Ivey LYNN,MSN, CNM 08/16/2015, 3:26 AM

## 2015-08-16 NOTE — Anesthesia Preprocedure Evaluation (Signed)
Anesthesia Evaluation  Patient identified by MRN, date of birth, ID band Patient awake    Reviewed: Allergy & Precautions, H&P , NPO status , Patient's Chart, lab work & pertinent test results  Airway Mallampati: I  TM Distance: >3 FB Neck ROM: full    Dental no notable dental hx.    Pulmonary neg pulmonary ROS,    Pulmonary exam normal        Cardiovascular negative cardio ROS Normal cardiovascular exam     Neuro/Psych negative neurological ROS  negative psych ROS   GI/Hepatic negative GI ROS, Neg liver ROS,   Endo/Other  negative endocrine ROS  Renal/GU negative Renal ROS     Musculoskeletal   Abdominal (+) + obese,   Peds  Hematology negative hematology ROS (+)   Anesthesia Other Findings   Reproductive/Obstetrics (+) Pregnancy                             Anesthesia Physical Anesthesia Plan  ASA: II  Anesthesia Plan: Epidural   Post-op Pain Management:    Induction:   Airway Management Planned:   Additional Equipment:   Intra-op Plan:   Post-operative Plan:   Informed Consent: I have reviewed the patients History and Physical, chart, labs and discussed the procedure including the risks, benefits and alternatives for the proposed anesthesia with the patient or authorized representative who has indicated his/her understanding and acceptance.     Plan Discussed with:   Anesthesia Plan Comments:         Anesthesia Quick Evaluation  

## 2015-08-16 NOTE — Lactation Note (Signed)
This note was copied from the chart of Kelsey [name]. Lactation Consultation Note  Patient Name: Kelsey Monet [place] FBPZW'C Date: 08/16/2015 Reason for consult: Initial assessment     With this mom of a term baby, now 80 hours old. Mom reports latching painful on left breast, and with latch on right breast, mom reports a paine level of 5 out of 10. Mom had a severe nipple stripe after this feeding, on her right breast. I advised mom to apply EBM, and gave her comfort gels, with instruction on their use. The baby has an upper lip frenulum that extends to the gum line, and a posterior tongue frenulum that is close to the tip of the tongue, and short, causing a bowl shaped tongue with elevation, baby has a high palate, b ut is able to extend her tongue well beyond the lower gum line, with spoon feeding. While the baby ws latched t the right breast, she was sucking vigorously for 25 minutes, with visible swallows and good breast movement. The baby was still hungry after latching, so I easily hand expressed 5 ml's of colostrum and spoon fed this to the baby.  Due to mom's discomfot, I fitted her with a 20 nipple shield, and instructed her in it use and application. Mom will need help applying for the first time. I did apply the shield, after she fed, but the baby ws not interested in sucking at this time. Mom knows to keep baby skin to skin as much as possible.  I set up a DEP for mom, and told her how to set for premie setting, and advised her to pump, followed by hand expression, 4-6 times a day - about every other feeding.  Mom very receptive to teaching. Review of breastfeeding basics done, as well as review of lactation services.  Mom knows to call for questions/concern.    Maternal Data Formula Feeding for Exclusion: No Has patient been taught Hand Expression?: Yes Does the patient have breastfeeding experience prior to this delivery?: No  Feeding Feeding Type: Breast Fed Length of feed: 25  min  LATCH Score/Interventions Latch: Grasps breast easily, tongue down, lips flanged, rhythmical sucking. Intervention(s): Skin to skin;Teach feeding cues  Audible Swallowing: Spontaneous and intermittent Intervention(s): Skin to skin;Hand expression  Type of Nipple: Everted at rest and after stimulation  Comfort (Breast/Nipple): Filling, red/small blisters or bruises, mild/mod discomfort  Problem noted: Mild/Moderate discomfort (severe nipple stripe after feeding, baby with tight upper lip and tongue frenulum) Interventions (Mild/moderate discomfort): Hand expression;Post-pump;Comfort gels  Hold (Positioning): Assistance needed to correctly position infant at breast and maintain latch. Intervention(s): Breastfeeding basics reviewed;Support Pillows;Position options;Skin to skin  LATCH Score: 8  Lactation Tools Discussed/Used Tools: Nipple Shields Nipple shield size: 20 Pump Review: Setup, frequency, and cleaning;Milk Storage;Other (comment) (premie setting, hand expression, nipple shield) Initiated by:: Franky Macho, RN, IBCLC Date initiated:: 08/16/15   Consult Status Consult Status: Follow-up Date: 08/17/15 Follow-up type: In-patient    Tonna Corner 08/16/2015, 3:11 PM

## 2015-08-16 NOTE — Progress Notes (Signed)
No evidenced-based data reflecting any benefit for encapsulation, and no information on chorio's impact on placental encapsulation.  Patient has her placenta with her in her [place] room. I do not recommend encapsulation in light of chorio--communicated this information to the patient.  Donnel Saxon, CNM 08/16/15 4:10p

## 2015-08-16 NOTE — Progress Notes (Signed)
Patient sleeping at present per note on door.  Per RN, patient doing well on day 0. Up to BR, spontaneous voiding.  Will see later today.  Donnel Saxon, CNM 08/16/15 9a

## 2015-08-16 NOTE — Progress Notes (Signed)
Subjective: Postpartum Day 0: Vaginal delivery, no laceration Patient up ad lib, reports no syncope or dizziness. Feeding:  Breast Contraceptive plan:  Undecided  Questions whether placenta went to pathology or not, and whether encapsulation is OK given fever and elevated WBC ct in labor.  Objective: Vital signs in last 24 hours: Temp:  [98.3 F (36.8 C)-100.1 F (37.8 C)] 98.9 F (37.2 C) (10/13 0730) Pulse Rate:  [68-107] 92 (10/13 0730) Resp:  [18-20] 20 (10/13 0730) BP: (78-133)/(46-79) 99/52 mmHg (10/13 0730) SpO2:  [98 %-100 %] 99 % (10/13 0148)  Physical Exam:  General: alert Lochia: appropriate Uterine Fundus: firm Perineum: Intact DVT Evaluation: No evidence of DVT seen on physical exam. Negative Homan's sign.  On Unasyn 3 gm x 24 hours.   CBC Latest Ref Rng 08/16/2015 08/15/2015 11/17/2012  WBC 4.0 - 10.5 K/uL 23.1(H) 22.2(H) -  Hemoglobin 12.0 - 15.0 g/dL 12.7 12.8 13.4  Hematocrit 36.0 - 46.0 % 37.3 37.1 -  Platelets 150 - 400 K/uL 158 157 -     Assessment/Plan: Status post vaginal delivery day 0. Hx maternal fever and elevated WBC ct Stable Continue current care. I will investigate patient's questions.    Kellsey Sansone, [place] 08/16/2015, 11:25 AM

## 2015-08-16 NOTE — Anesthesia Procedure Notes (Signed)
Epidural Patient location during procedure: OB Start time: 08/16/2015 12:09 AM End time: 08/16/2015 12:13 AM  Staffing Anesthesiologist: Lyn Hollingshead Performed by: anesthesiologist   Preanesthetic Checklist Completed: patient identified, surgical consent, pre-op evaluation, timeout performed, IV checked, risks and benefits discussed and monitors and equipment checked  Epidural Patient position: sitting Prep: site prepped and draped and DuraPrep Patient monitoring: continuous pulse ox and blood pressure Approach: midline Location: L3-L4 Injection technique: LOR air  Needle:  Needle type: Tuohy  Needle gauge: 17 G Needle length: 9 cm and 9 Needle insertion depth: 6 cm Catheter type: closed end flexible Catheter size: 19 Gauge Catheter at skin depth: 11 cm Test dose: negative and Other  Assessment Sensory level: T9 Events: blood not aspirated, injection not painful, no injection resistance, negative IV test and no paresthesia  Additional Notes Reason for block:procedure for pain

## 2015-08-16 NOTE — Lactation Note (Signed)
This note was copied from the chart of Kelsey Olena Willy. Lactation Consultation Note Follow up visit at 15 hours of age.  Mom reports a recent feeding of about 20 minutes, that was painful and nipple with not round after feeding.  Assisted mom with application of NS, mom is able to return demonstration.  Assisted with latch in cross cradle hold.  Assistance needed, baby latched for several minutes of intermittent sucking, baby sleepy.  Encouraged mom to  Work on positioning and latching with mouth wide open for deep latch.  Mom does report some disomfort with pumping and may have more sensitive nipples. Baby remains STS, mom to feed on demand with NS to protect nipples.  Noted short lingual frenulum when baby was crying.     Patient Name: Kelsey [name] [id number] Date: 08/16/2015 Reason for consult: Follow-up assessment;Breast/nipple pain;Difficult latch   Maternal Data    Feeding Feeding Type: Breast Fed Length of feed: 5 min  LATCH Score/Interventions Latch: Repeated attempts needed to sustain latch, nipple held in mouth throughout feeding, stimulation needed to elicit sucking reflex. Intervention(s): Skin to skin;Teach feeding cues;Waking techniques Intervention(s): Breast massage;Breast compression  Audible Swallowing: None  Type of Nipple: Everted at rest and after stimulation  Comfort (Breast/Nipple): Filling, red/small blisters or bruises, mild/mod discomfort  Problem noted: Mild/Moderate discomfort Interventions (Mild/moderate discomfort): Pre-pump if needed;Post-pump;Hand massage;Hand expression  Hold (Positioning): Assistance needed to correctly position infant at breast and maintain latch. Intervention(s): Breastfeeding basics reviewed;Position options;Skin to skin;Support Pillows  LATCH Score: 5  Lactation Tools Discussed/Used Nipple shield size: 20   Consult Status Consult Status: Follow-up Date: 08/17/15 Follow-up type: In-patient    Justice Britain 08/16/2015, 7:07 PM

## 2015-08-17 ENCOUNTER — Encounter (HOSPITAL_COMMUNITY): Payer: Self-pay | Admitting: *Deleted

## 2015-08-17 LAB — CBC
HCT: 29.5 % — ABNORMAL LOW (ref 36.0–46.0)
HEMOGLOBIN: 10 g/dL — AB (ref 12.0–15.0)
MCH: 30.6 pg (ref 26.0–34.0)
MCHC: 33.9 g/dL (ref 30.0–36.0)
MCV: 90.2 fL (ref 78.0–100.0)
PLATELETS: 133 10*3/uL — AB (ref 150–400)
RBC: 3.27 MIL/uL — AB (ref 3.87–5.11)
RDW: 13.8 % (ref 11.5–15.5)
WBC: 12.6 10*3/uL — AB (ref 4.0–10.5)

## 2015-08-17 NOTE — Progress Notes (Signed)
CLINICAL SOCIAL WORK MATERNAL/CHILD NOTE  Patient Details  Name: Kelsey [name] MRN: [id number] Date of Birth: 08/16/2013  Date: 08/17/2015  Clinical Social Worker Initiating Note: Lucita Ferrara, LCSW and Elissa Hefty, MSW intern  Date/ Time Initiated: 08/17/15/1300   Child's Name: Kelsey [name]    Legal Guardian: Kelsey [name] (MOB) and Kelsey [name] (FOB)   Need for Interpreter: None   Date of Referral: 08/16/15   Reason for Referral: Other- Midwife request   Referral Source: [facility]   Address: [address], [place] [address]  Phone number: [phone]   Household Members: Self, Significant Other   Natural Supports (not living in the home): Immediate Family, Spouse/significant other   Professional Supports:None   Employment:Full-time   Type of Work:     Education: Printmaker   Other Resources:     Cultural/Religious Considerations Which May Impact Care: None Reported   Strengths: Ability to meet basic needs , Home prepared for child    Risk Factors/Current Problems: None   Cognitive State: Linear Thinking , Able to Concentrate , Goal Oriented    Mood/Affect: Calm , Comfortable , Happy    CSW Assessment: MSW intern and CSW presented in patient's room due to a Midwife request. FOB was present in the room and MOB provided verbal consent for MSW intern and CSW to engage. MOB presented to be in a happy mood as evidence by her sitting in the couch, occassionally smiling, and caring for the infant. Per MOB, she was feeling well and transitioning well into postpartum. MOB shared the birthing process did not go as planned but voiced that she was okay with the overall birthing process because her infant was healthy. MOB shared she is breastfeeding the infant and is still learning how to adjust to breastfeeding. MOB stated the infant is latching  on better and that her last feedings had gone well. Per MOB, it is a learning process and she is adjusting to it but did not voice any concerns. MOB shared she has a great support system from her significant other and immediate family members. MOB stated she is taking 3 months of FMLA and has a great support at her place of employment in regard to breast pumping while at work. MOB stated they have a designated nursing room for mothers at work. FOB stated he is a full time student and will be graduating in December. MOB voiced feeling prepared to take the infant home and meeting the infant's basic needs.   CSW inquired about MOB's mental health during the pregnancy. MOB denied having any prior mental health concerns or any concerns during the pregnancy. MSW intern provided MOB with education on perinatal mood disorders and the hospital's support group, "Feelings After Birth." CSW also briefly went over the postpartum mental health self reflection scale. MOB was getting ready to breastfeed the infant and asked if MSW intern and CSW could come back. CSW asked MOB if she had any further concerns or questions. MOB denied having any but agreed to contact CSW if needs arise.   CSW Plan/Description:  Engineer, mining- MSW intern provided education on perinatal mood disorders along with handouts for "Feelings After Birth." No Further Intervention Required/No Barriers to Discharge    Trevor Iha, Student-SW 08/17/2015, 1:22 PM

## 2015-08-17 NOTE — Lactation Note (Signed)
This note was copied from the chart of Kelsey [name]. Lactation Consultation Note  Patient Name: Kelsey [name] [id number] Date: 08/17/2015 Reason for consult: Difficult latch;Hyperbilirubinemia RN requested lactation because mom has very sore nipples. Mom reports that she is using nipples shields bilaterally and post pumping. The nipples appear normal, no bruising, crackes, bleeding, or compressions stripes. Tried the football and cradle positions but mom reports the nipple pain is too bad. Baby does have a tight labial frenulum and a visible lingual frenulum, but is able to extend tongue past gum ridge, lift above midline, and has good lateralization of tongue. She was able to pump drops of colostrum using the DEBP for 15 minutes. Suggested that she use her milk and the gel pads after feedings. She will continue to pump as long as she is using the shield, with the goal to get off the shield as soon as possible. Her mother and mother in law were giving her tips, they are very supportive. She is aware of O/P lactation and support group.    Maternal Data    Feeding Feeding Type: Breast Fed Length of feed: 10 min  LATCH Score/Interventions Latch: Grasps breast easily, tongue down, lips flanged, rhythmical sucking. Intervention(s): Skin to skin Intervention(s): Adjust position  Audible Swallowing: A few with stimulation Intervention(s): Hand expression Intervention(s): Alternate breast massage  Type of Nipple: Everted at rest and after stimulation  Comfort (Breast/Nipple): Engorged, cracked, bleeding, large blisters, severe discomfort Problem noted:  (very sore) Intervention(s): Expressed breast milk to nipple  Problem noted: Severe discomfort Interventions (Mild/moderate discomfort): Comfort gels;Post-pump Interventions (Severe discomfort): Double electric pum  Hold (Positioning): Assistance needed to correctly position infant at breast and maintain latch. Intervention(s):  Support Pillows  LATCH Score: 6  Lactation Tools Discussed/Used     Consult Status Consult Status: Follow-up Date: 08/18/15 Follow-up type: In-patient    Denzil Hughes 08/17/2015, 7:13 PM

## 2015-08-17 NOTE — Progress Notes (Signed)
Kelsey [name]   Subjective: Post Partum Day 1 Vaginal delivery, no laceration Patient up ad lib, denies syncope or dizziness. Reports consuming regular diet without issues and denies N/V No issues with urination and reports bleeding is appropriate  Feeding:  breast Contraceptive plan:   condoms  Objective: Temp:  [97.7 F (36.5 C)-98.4 F (36.9 C)] 97.7 F (36.5 C) (10/14 0455) Pulse Rate:  [63-79] 63 (10/14 0455) Resp:  [18-20] 18 (10/14 0455) BP: (103-110)/(59-67) 104/61 mmHg (10/14 0455)  Physical Exam:  General: alert and cooperative Ext: WNL, no significant  edema. No evidence of DVT seen on physical exam. Breast: Soft filling Lungs: CTAB Heart RRR without murmur  Abdomen:  Soft, fundus firm, lochia scant, + bowel sounds, non distended, non tender Lochia: appropriate Uterine Fundus: firm Laceration: n/a    Recent Labs  08/16/15 0310 08/17/15 0405  HGB 12.7 10.0*  HCT 37.3 29.5*    Assessment S/P Vaginal Delivery-Day 1 Stable  Normal Involution Breastfeeding Pp abx not complete   Plan: Continue current care Plan for discharge tomorrow, Breastfeeding, Lactation consult and Social Work consult Lactation support   Derik Fults, CNM, MSN 08/17/2015, 10:34 AM

## 2015-08-18 NOTE — Lactation Note (Signed)
This note was copied from the chart of Kelsey Nikeisha Klutz. Lactation Consultation Note  Patient Name: Kelsey [name] [id number] Date: 08/18/2015 Reason for consult: Follow-up assessment   Follow-up consult at 44 hours old;   Noted that infant's suck on gloved finger, infant was biting with a tight jaw but tongue maintained contact with finger while infant sucked with a rhythmical movement. Taught mom asymmetrical latching technique and how to flange bottom lip.   After a few attempts infant latched and fed in a consistent pattern with shield; several swallows heard and infant even started coughing and came off breast.  Milk noted in shield and all around infant's mouth.  Mom stated pain was a "2".  LS-8. Discussed in depth tongue function causing nipple pain versus latching properly.  Encouraged tummy time and benefits for helping loosen tight muscles that can affect tongue function. Encouraged attending support group on Monday night or Tuesday.   Discussed milk-coming in phase and pumping as needed for comfort.  Mom's breast are filling and milk is beginning to come-in.  Discussed ways to begin weaning off nipple shield after a few days when breastfeeding is well established. Mom has Personal DEBP she plans to use at home.  Is currently using #20 flanges but stated she is having some discomfort on right side; LC discussed increasing right side to #24 flanges from kit and turning pump setting down to a comfortable but effective suck pressure.   Encouraged mom to call for questions or outpatient appointment as needed after discharge.  Mom anticipates going home this evening.     Feeding Feeding Type: Breast Fed Length of feed: 8 min  LATCH Score/Interventions Latch: Repeated attempts needed to sustain latch, nipple held in mouth throughout feeding, stimulation needed to elicit sucking reflex. Intervention(s): Teach feeding cues;Waking techniques;Skin to skin Intervention(s): Breast  massage;Assist with latch  Audible Swallowing: Spontaneous and intermittent  Type of Nipple: Everted at rest and after stimulation  Comfort (Breast/Nipple): Filling, red/small blisters or bruises, mild/mod discomfort Intervention(s): Expressed breast milk to nipple  Problem noted: Mild/Moderate discomfort Interventions (Mild/moderate discomfort): Comfort gels  Hold (Positioning): No assistance needed to correctly position infant at breast. Intervention(s): Breastfeeding basics reviewed;Support Pillows;Skin to skin  LATCH Score: 8  Lactation Tools Discussed/Used Tools: Nipple Shields Nipple shield size: 20   Consult Status Consult Status: Complete    Merlene Laughter 08/18/2015, 4:52 PM

## 2015-08-18 NOTE — Discharge Instructions (Signed)

## 2015-08-18 NOTE — Discharge Summary (Signed)
OB Discharge Summary     Patient Name: Kelsey [name] DOB: 1986-09-18 MRN: [id number]  Date of admission: 08/15/2015 Delivering MD: Gavin Pound   Date of discharge: 08/18/2015  Admitting diagnosis: 40 WEEKS ROM Intrauterine pregnancy: [redacted]w[redacted]d     Secondary diagnosis: Prolonged ROM     Discharge diagnosis: Term Pregnancy Delivered and Chorioamnionitis, Prolonged ROM                                                                                                Post partum procedures:None  Augmentation: Pitocin  Complications: Maternal fever, elevated WBC count.  Hospital course:  Onset of Labor With Vaginal Delivery     29 y.o. yo G1P1001 at [redacted]w[redacted]d was admitted in Latent Laboron 08/15/2015. Patient had an uncomplicated labor course as follows:  Membrane Rupture Time/Date: 6:15 PM ,08/13/2015  Patient declined augmentation until 50 hrs s/p SROM. She had desired waterbirth, and used tub, but then required augmentation when labor progress plateaued at 5 cm, and received epidural.  She then progressed to delivery.  She received Unasyn during labor for fever and elevated WBC ct, with presumptive chorioamnionitis.  ATB was continued for 24 hours PP. Intrapartum Procedures: Episiotomy: None [1]                                         Lacerations:  None [1]  Patient had a delivery of a Viable infant. 08/16/2015  Information for the patient's newborn:  Kelsey [name] [[id number]]  Delivery Method: Vaginal, Spontaneous Delivery (Filed from Delivery Summary)    Pateint had an uncomplicated postpartum course.  She is ambulating, tolerating a regular diet, passing flatus, and urinating well. Patient is discharged home in stable condition on10/15/16,   Physical exam  Filed Vitals:   08/16/15 1720 08/17/15 0455 08/17/15 1805 08/18/15 0521  BP: 110/67 104/61 115/70 105/65  Pulse: 76 63 72 63  Temp: 98.4 F (36.9 C) 97.7 F (36.5 C) 98.5 F (36.9 C) 98.4 F (36.9 C)  TempSrc:  Oral  Oral Oral  Resp: 19 18 18 19   Height:      Weight:      SpO2:       General: alert Lochia: appropriate Uterine Fundus: firm Incision: Perineum intact DVT Evaluation: No evidence of DVT seen on physical exam. Labs: Lab Results  Component Value Date   WBC 12.6* 08/17/2015   HGB 10.0* 08/17/2015   HCT 29.5* 08/17/2015   MCV 90.2 08/17/2015   PLT 133* 08/17/2015   No flowsheet data found.  Discharge instruction: per After Visit Summary and "Baby and Me Booklet".  Medications:  Current facility-administered medications:  .  acetaminophen (TYLENOL) tablet 650 mg, 650 mg, Oral, Q4H PRN, Gavin Pound, CNM .  benzocaine-Menthol (DERMOPLAST) 20-0.5 % topical spray 1 application, 1 application, Topical, PRN, Gavin Pound, CNM .  witch hazel-glycerin (TUCKS) pad 1 application, 1 application, Topical, PRN **AND** dibucaine (NUPERCAINAL) 1 % rectal ointment 1 application, 1 application, Rectal, PRN, Gavin Pound, CNM .  diphenhydrAMINE (  BENADRYL) capsule 25 mg, 25 mg, Oral, Q6H PRN, Gavin Pound, CNM .  ibuprofen (ADVIL,MOTRIN) tablet 600 mg, 600 mg, Oral, 4 times per day, Gavin Pound, CNM .  lanolin ointment, , Topical, PRN, Gavin Pound, CNM .  ondansetron (ZOFRAN) tablet 4 mg, 4 mg, Oral, Q4H PRN **OR** ondansetron (ZOFRAN) injection 4 mg, 4 mg, Intravenous, Q4H PRN, Gavin Pound, CNM .  oxyCODONE-acetaminophen (PERCOCET/ROXICET) 5-325 MG per tablet 1 tablet, 1 tablet, Oral, Q4H PRN, Gavin Pound, CNM .  oxyCODONE-acetaminophen (PERCOCET/ROXICET) 5-325 MG per tablet 2 tablet, 2 tablet, Oral, Q4H PRN, Gavin Pound, CNM .  prenatal multivitamin tablet 1 tablet, 1 tablet, Oral, Q1200, Gavin Pound, CNM, 1 tablet at 08/16/15 1301 .  senna-docusate (Senokot-S) tablet 2 tablet, 2 tablet, Oral, Q24H, Gavin Pound, CNM, 2 tablet at 08/17/15 0000 .  simethicone (MYLICON) chewable tablet 80 mg, 80 mg, Oral, PRN, Gavin Pound, CNM .  zolpidem (AMBIEN) tablet 5 mg, 5 mg, Oral, QHS PRN, Gavin Pound,  CNM  Diet: routine diet  Activity: Advance as tolerated. Pelvic rest for 6 weeks.   Outpatient follow up:6 weeks  Postpartum contraception: Condoms  Newborn Data: Live born female  Birth Weight: 7 lb 12.9 oz (3540 g) APGAR: 9, 9  Baby Feeding: Breast Disposition:Currently under bili blanket at time of mother's d/c.   08/18/2015 Donnel Saxon, CNM

## 2015-08-18 NOTE — Lactation Note (Signed)
This note was copied from the chart of Kelsey [name]. Lactation Consultation Note : Arrived in mother's room at the end of a 20 min feeding. Observed a few sucks and infant released the breast. Observed that only a small amt of colostrum was in the nipple shield. Lots of teaching with mother on proper use and application of the nipple shield.  Mother applied #20 nipple shield with observation. assist mother with latching infant on the right breast. Father taught to observed for good latch. Reviewed latch in baby and me book. Infant sustained latch for 15-20 mins but very tiny amt of colostrum transferred. Mother states that she is unable to get infant on the bare breast without pain. Mother's nipple are pink. Advised in stimulating nipples for firmness before latching infant and if unable to sustain latch use the nipple shield. Mother's breast are filling. Assist with hand expression and infant was spoon fed 5 ml . Mother advised in post pumping with DEBP and page for assistance for the next feeding. Reviewed treatment to prevent engorgement and discussed milk storage guidelines. Mother receptive to all teaching.  Patient Name: Kelsey [name] [id number] Date: 08/18/2015 Reason for consult: Follow-up assessment   Maternal Data    Feeding Feeding Type: Breast Fed Length of feed: 8 min  LATCH Score/Interventions Latch: Repeated attempts needed to sustain latch, nipple held in mouth throughout feeding, stimulation needed to elicit sucking reflex. Intervention(s): Teach feeding cues;Waking techniques;Skin to skin Intervention(s): Breast massage;Assist with latch  Audible Swallowing: Spontaneous and intermittent  Type of Nipple: Everted at rest and after stimulation  Comfort (Breast/Nipple): Filling, red/small blisters or bruises, mild/mod discomfort Intervention(s): Expressed breast milk to nipple  Problem noted: Mild/Moderate discomfort Interventions (Mild/moderate discomfort): Comfort  gels  Hold (Positioning): No assistance needed to correctly position infant at breast. Intervention(s): Breastfeeding basics reviewed;Support Pillows;Skin to skin  LATCH Score: 8  Lactation Tools Discussed/Used Tools: Nipple Shields Nipple shield size: 20   Consult Status Consult Status: Complete    Darla Lesches 08/18/2015, 4:53 PM

## 2016-11-27 DIAGNOSIS — R87619 Unspecified abnormal cytological findings in specimens from cervix uteri: Secondary | ICD-10-CM | POA: Diagnosis not present

## 2016-11-27 DIAGNOSIS — Z124 Encounter for screening for malignant neoplasm of cervix: Secondary | ICD-10-CM | POA: Diagnosis not present

## 2016-12-17 DIAGNOSIS — R87619 Unspecified abnormal cytological findings in specimens from cervix uteri: Secondary | ICD-10-CM | POA: Diagnosis not present

## 2016-12-17 DIAGNOSIS — O99280 Endocrine, nutritional and metabolic diseases complicating pregnancy, unspecified trimester: Secondary | ICD-10-CM | POA: Diagnosis not present

## 2016-12-17 DIAGNOSIS — Z1321 Encounter for screening for nutritional disorder: Secondary | ICD-10-CM | POA: Diagnosis not present

## 2016-12-17 DIAGNOSIS — R87610 Atypical squamous cells of undetermined significance on cytologic smear of cervix (ASC-US): Secondary | ICD-10-CM | POA: Diagnosis not present

## 2016-12-17 DIAGNOSIS — Z3A01 Less than 8 weeks gestation of pregnancy: Secondary | ICD-10-CM | POA: Diagnosis not present

## 2016-12-23 NOTE — Progress Notes (Signed)
[name] [place], [place] [address] Phone: [phone] Subjective:     CC: neck pain   [id number]  Kelsey [name] is a 31 y.o. female coming in with complaint of neck pain.  Patient is having this pain for quite some time. Saw a Restaurant manager, fast food. Patient did have x-rays better. Was that she had degenerative disc disease. Patient wants a second opinion. Patient states that she is an aching sensation that seems to be in her neck. Patient does work out on a regular basis. Does a lot of heavy lifting. Noticing recently but has had some mild dizziness as well. Just does not feel like herself. Denies any headache, any visual changes, any radiation down the arm or any numbness or weakness. More uncomfortable than truly even of pain. Sometimes can make it difficult to follow sleep at night. Does respond to anti-inflammatories but likes to stay away from it if possible.     Past Medical History:  Diagnosis Date  . Abnormal pap    ASC-US   . History of HPV infection   . Vulvar lesion    Past Surgical History:  Procedure Laterality Date  . KNEE SURGERY     Social History   Social History  . Marital status: Single    Spouse name: N/A  . Number of children: N/A  . Years of education: N/A   Social History Main Topics  . Smoking status: Never Smoker  . Smokeless tobacco: Never Used  . Alcohol use Yes     Comment: socially not while pregnant  . Drug use: No  . Sexual activity: Yes    Partners: Male    Birth control/ protection: None   Other Topics Concern  . None   Social History Narrative  . None   No Known Allergies Family History  Problem Relation Age of Onset  . Hyperlipidemia Father   . Hypertension Father   . Cancer Maternal Grandfather   . Arthritis Paternal Grandmother     Past medical history, social, surgical and family history all reviewed in electronic medical record.  No pertanent information unless stated regarding  to the chief complaint.   Review of Systems:Review of systems updated and as accurate as of 12/24/16  No headache, visual changes, nausea, vomiting, diarrhea, constipation,  abdominal pain, skin rash, fevers, chills, night sweats, weight loss, swollen lymph nodes, body aches, joint swelling,  chest pain, shortness of breath, mood changes. Positive for dizziness and some muscle aches  Objective  Blood pressure 104/68, pulse 71, height 5\' 4"  (1.626 m), weight 132 lb (59.9 kg), SpO2 99 %, unknown if currently breastfeeding. Systems examined below as of 12/24/16   General: No apparent distress alert and oriented x3 mood and affect normal, dressed appropriately.  HEENT: Pupils equal, extraocular movements intact  Respiratory: Patient's speak in full sentences and does not appear short of breath  Cardiovascular: No lower extremity edema, non tender, no erythema  Skin: Warm dry intact with no signs of infection or rash on extremities or on axial skeleton.  Abdomen: Soft nontender  Neuro: Cranial nerves II through XII are intact, neurovascularly intact in all extremities with 2+ DTRs and 2+ pulses.  Lymph: No lymphadenopathy of posterior or anterior cervical chain or axillae bilaterally.  Gait normal with good balance and coordination.  MSK:  Non tender with full range of motion and good stability and symmetric strength and tone of shoulders, elbows, wrist, hip, knee and ankles bilaterally.  Neck:  Inspection unremarkable. No palpable stepoffs. Negative Spurling's maneuver. Limited range of motion lacking the last 10 of rotation to the right. Otherwise fairly unremarkable. Grip strength and sensation normal in bilateral hands Strength good C4 to T1 distribution No sensory change to C4 to T1 Negative Hoffman sign bilaterally Reflexes normal Scapular dyskinesia noted on the left side  Osteopathic findings Cervical C2 flexed rotated and side bent left C4 flexed rotated and side bent left C6  flexed rotated and side bent right T3 extended rotated and side bent left inhaled third rib T6 extended rotated and side bent left L2 flexed rotated and side bent right Sacrum right on right  Procedure note 97110; 15 minutes spent for Therapeutic exercises as stated in above notes.  This included exercises focusing on stretching, strengthening, with significant focus on eccentric aspects.  Shoulder Exercises that included:  Basic scapular stabilization to include adduction and depression of scapula Scaption, focusing on proper movement and good control Rows with theraband   Proper technique shown and discussed handout in great detail with ATC.  All questions were discussed and answered.     Impression and Recommendations:     This case required medical decision making of moderate complexity.      Note: This dictation was prepared with Dragon dictation along with smaller phrase technology. Any transcriptional errors that result from this process are unintentional.

## 2016-12-24 ENCOUNTER — Encounter: Payer: Self-pay | Admitting: Family Medicine

## 2016-12-24 ENCOUNTER — Other Ambulatory Visit (INDEPENDENT_AMBULATORY_CARE_PROVIDER_SITE_OTHER): Payer: BLUE CROSS/BLUE SHIELD

## 2016-12-24 ENCOUNTER — Ambulatory Visit (INDEPENDENT_AMBULATORY_CARE_PROVIDER_SITE_OTHER): Payer: BLUE CROSS/BLUE SHIELD | Admitting: Family Medicine

## 2016-12-24 VITALS — BP 104/68 | HR 71 | Ht 64.0 in | Wt 132.0 lb

## 2016-12-24 DIAGNOSIS — M542 Cervicalgia: Secondary | ICD-10-CM

## 2016-12-24 DIAGNOSIS — G2589 Other specified extrapyramidal and movement disorders: Secondary | ICD-10-CM | POA: Diagnosis not present

## 2016-12-24 DIAGNOSIS — M999 Biomechanical lesion, unspecified: Secondary | ICD-10-CM | POA: Insufficient documentation

## 2016-12-24 LAB — IBC PANEL
Iron: 61 ug/dL (ref 42–145)
Saturation Ratios: 13.9 % — ABNORMAL LOW (ref 20.0–50.0)
Transferrin: 314 mg/dL (ref 212.0–360.0)

## 2016-12-24 LAB — VITAMIN D 25 HYDROXY (VIT D DEFICIENCY, FRACTURES): VITD: 38.33 ng/mL (ref 30.00–100.00)

## 2016-12-24 NOTE — Patient Instructions (Signed)
Good to see you  Exercises 3 times a week.  On wall with heels, butt shoulder and head touching for a goal of 5 minutes daily  Avoid activity with your hands outside your peripheral vision.  Ice is your friend.  See me again in 4 weeks.

## 2016-12-24 NOTE — Assessment & Plan Note (Addendum)
Decision today to treat with OMT was based on Physical Exam  After verbal consent patient was treated with HVLA, ME, FPR techniques in cervical, thoracic, rib, lumbar and sacral areas  Patient tolerated the procedure well with improvement in symptoms  Patient given exercises, stretches and lifestyle modifications  See medications in patient instructions if given  Patient will follow up in 4 weeks 

## 2016-12-24 NOTE — Assessment & Plan Note (Signed)
Patient does have more of a dyskinesis. We discussed home exercise, icing pedicle, which activities to do. Patient work with Product/process development scientist to learn home exercises in greater detail. Responded very well to osteopathic manipulation today. Encourage her to continue stay active. Follow-up again in 4 weeks.

## 2016-12-25 ENCOUNTER — Telehealth: Payer: Self-pay

## 2016-12-25 NOTE — Telephone Encounter (Signed)
-----   Message from Lyndal Pulley, DO sent at 12/25/2016  7:47 AM EST ----- Iron is a little low.   I would consider iron 65 mg at least 3 times a week with 500mg  of vitamin C Vit D is normal.

## 2016-12-25 NOTE — Telephone Encounter (Signed)
Sent MyChart message to patient

## 2016-12-25 NOTE — Telephone Encounter (Signed)
Spoke with patient regarding labs.

## 2016-12-25 NOTE — Progress Notes (Unsigned)
Here yo go.

## 2017-01-21 ENCOUNTER — Encounter: Payer: Self-pay | Admitting: Family Medicine

## 2017-01-21 ENCOUNTER — Ambulatory Visit (INDEPENDENT_AMBULATORY_CARE_PROVIDER_SITE_OTHER): Payer: BLUE CROSS/BLUE SHIELD | Admitting: Family Medicine

## 2017-01-21 DIAGNOSIS — G2589 Other specified extrapyramidal and movement disorders: Secondary | ICD-10-CM | POA: Diagnosis not present

## 2017-01-21 DIAGNOSIS — M999 Biomechanical lesion, unspecified: Secondary | ICD-10-CM

## 2017-01-21 NOTE — Patient Instructions (Signed)
Good to see you  You are awesome Don't change a thing See me again in 6-8 weeks

## 2017-01-21 NOTE — Assessment & Plan Note (Signed)
Decision today to treat with OMT was based on Physical Exam  After verbal consent patient was treated with HVLA, ME, FPR techniques in cervical, thoracic, lumbar and sacral areas  Patient tolerated the procedure well with improvement in symptoms  Patient given exercises, stretches and lifestyle modifications  See medications in patient instructions if given  Patient will follow up in 6 weeks 

## 2017-01-21 NOTE — Assessment & Plan Note (Signed)
Has not significantly well at this time. We discussed with patient at great length. We discussed which activities to do in which ones to potentially avoid. Encourage patient to continue to stay active. Patient will continue to work on posture and ergonomics and muscle imbalances. Patient did well and will follow-up in 6 weeks.

## 2017-01-21 NOTE — Progress Notes (Signed)
[name] [place], [place] [address] Phone: [phone] Subjective:     CC: neck pain f/u  [id number]  Kelsey [name] is a 31 y.o. female coming in with complaint of neck pain.  Patient is having this pain for quite some time. Saw a Restaurant manager, fast food. Patient did have x-rays better. Was that she had degenerative disc disease. Patient was found to have more of a scapular dyskinesia. Has been doing the exercises fairly regularly. Feels like she is making significant progress. Still has some soreness but nothing as severe. Patient did the exercises more she feels like she would do even better. Denies any numbness. No new symptoms just some mild worsening from previous symptoms. Patient states that the manipulation helped out significantly better.     Past Medical History:  Diagnosis Date  . Abnormal pap    ASC-US   . History of HPV infection   . Vulvar lesion    Past Surgical History:  Procedure Laterality Date  . KNEE SURGERY     Social History   Social History  . Marital status: Single    Spouse name: N/A  . Number of children: N/A  . Years of education: N/A   Social History Main Topics  . Smoking status: Never Smoker  . Smokeless tobacco: Never Used  . Alcohol use Yes     Comment: socially not while pregnant  . Drug use: No  . Sexual activity: Yes    Partners: Male    Birth control/ protection: None   Other Topics Concern  . None   Social History Narrative  . None   No Known Allergies Family History  Problem Relation Age of Onset  . Hyperlipidemia Father   . Hypertension Father   . Cancer Maternal Grandfather   . Arthritis Paternal Grandmother     Past medical history, social, surgical and family history all reviewed in electronic medical record.  No pertanent information unless stated regarding to the chief complaint.   Review of Systems: No headache, visual changes, nausea, vomiting, diarrhea, constipation,  dizziness, abdominal pain, skin rash, fevers, chills, night sweats, weight loss, swollen lymph nodes, body aches, joint swelling, muscle aches, chest pain, shortness of breath, mood changes.    Objective  unknown if currently breastfeeding.   Systems examined below as of 01/21/17 General: NAD A&O x3 mood, affect normal  HEENT: Pupils equal, extraocular movements intact no nystagmus Respiratory: not short of breath at rest or with speaking Cardiovascular: No lower extremity edema, non tender Skin: Warm dry intact with no signs of infection or rash on extremities or on axial skeleton. Abdomen: Soft nontender, no masses Neuro: Cranial nerves  intact, neurovascularly intact in all extremities with 2+ DTRs and 2+ pulses. Lymph: No lymphadenopathy appreciated today  Gait normal with good balance and coordination.  MSK: Non tender with full range of motion and good stability and symmetric strength and tone of shoulders, elbows, wrist,  knee hips and ankles bilaterally.   Neck: Inspection unremarkable. No palpable stepoffs. Negative Spurling's maneuver. Lacks last 5 of rotation. Grip strength and sensation normal in bilateral hands Strength good C4 to T1 distribution No sensory change to C4 to T1 Negative Hoffman sign bilaterally Reflexes normal  Osteopathic findings Cervical C2 flexed rotated and side bent right C4 flexed rotated and side bent left C6 flexed rotated and side bent left T3 extended rotated and side bent right inhaled third rib T7 extended rotated and side bent left L2 flexed  rotated and side bent right Sacrum right on right     Impression and Recommendations:     This case required medical decision making of moderate complexity.      Note: This dictation was prepared with Dragon dictation along with smaller phrase technology. Any transcriptional errors that result from this process are unintentional.

## 2017-03-18 ENCOUNTER — Ambulatory Visit: Payer: BLUE CROSS/BLUE SHIELD | Admitting: Family Medicine

## 2017-06-01 ENCOUNTER — Ambulatory Visit (INDEPENDENT_AMBULATORY_CARE_PROVIDER_SITE_OTHER): Payer: BLUE CROSS/BLUE SHIELD | Admitting: Family Medicine

## 2017-06-01 ENCOUNTER — Encounter: Payer: Self-pay | Admitting: Family Medicine

## 2017-06-01 VITALS — BP 114/70 | HR 70 | Ht 64.0 in | Wt 133.0 lb

## 2017-06-01 DIAGNOSIS — M999 Biomechanical lesion, unspecified: Secondary | ICD-10-CM

## 2017-06-01 DIAGNOSIS — G2589 Other specified extrapyramidal and movement disorders: Secondary | ICD-10-CM

## 2017-06-01 NOTE — Assessment & Plan Note (Signed)
Continues and is in very mild weakness but now seems to be bilateral. I do think that we are improving. Patient is very active. Likely will do well. Can continue to follow up in 3 month intervals. No other big changes but we did discuss ergonomic changes that could be beneficial.

## 2017-06-01 NOTE — Progress Notes (Signed)
[name] [place], [place] [address] Phone: [phone] Subjective:     CC: neck pain f/u  [id number]  Kelsey [name] is a 31 y.o. female coming in with complaint of neck pain.  Patient is having this pain for quite some time. We seen patient and she was doing very well with conservative therapy. Patient seemed to be doing very well with osteopathic manipulation. Was to do the home exercises on a regular basis. Patient states doing very well overall. Patient states some mild tightness starting again. There is continuing taking iron. Patient has been doing relatively well and working on a regular basis. Patient though states that this tightness is starting to become aggravating again. No radiation down the arms, no dizziness.     Past Medical History:  Diagnosis Date  . Abnormal pap    ASC-US   . History of HPV infection   . Vulvar lesion    Past Surgical History:  Procedure Laterality Date  . KNEE SURGERY     Social History   Social History  . Marital status: Single    Spouse name: N/A  . Number of children: N/A  . Years of education: N/A   Social History Main Topics  . Smoking status: Never Smoker  . Smokeless tobacco: Never Used  . Alcohol use Yes     Comment: socially not while pregnant  . Drug use: No  . Sexual activity: Yes    Partners: Male    Birth control/ protection: None   Other Topics Concern  . None   Social History Narrative  . None   No Known Allergies Family History  Problem Relation Age of Onset  . Hyperlipidemia Father   . Hypertension Father   . Cancer Maternal Grandfather   . Arthritis Paternal Grandmother     Past medical history, social, surgical and family history all reviewed in electronic medical record.  No pertanent information unless stated regarding to the chief complaint.   Review of Systems: No headache, visual changes, nausea, vomiting, diarrhea, constipation, dizziness,  abdominal pain, skin rash, fevers, chills, night sweats, weight loss, swollen lymph nodes, body aches, joint swelling, chest pain, shortness of breath, mood changes.  Positive muscle aches   Objective  Height 5\' 4"  (1.626 m), weight 133 lb (60.3 kg), unknown if currently breastfeeding.   Systems examined below as of 06/01/17 General: NAD A&O x3 mood, affect normal  HEENT: Pupils equal, extraocular movements intact no nystagmus Respiratory: not short of breath at rest or with speaking Cardiovascular: No lower extremity edema, non tender Skin: Warm dry intact with no signs of infection or rash on extremities or on axial skeleton. Abdomen: Soft nontender, no masses Neuro: Cranial nerves  intact, neurovascularly intact in all extremities with 2+ DTRs and 2+ pulses. Lymph: No lymphadenopathy appreciated today  Gait normal with good balance and coordination.  MSK: Non tender with full range of motion and good stability and symmetric strength and tone of shoulders, elbows, wrist,  knee hips and ankles bilaterally.   Neck: Inspection unremarkable. No palpable stepoffs. Negative Spurling's maneuver. Next last 5 of range of motion and side bilaterally Grip strength and sensation normal in bilateral hands Strength good C4 to T1 distribution No sensory change to C4 to T1 Negative Hoffman sign bilaterally Reflexes normal  Osteopathic findings C2 flexed rotated and side bent right C4 flexed rotated and side bent left C7 flexed rotated and side bent left T3 extended rotated and side  bent right inhaled third rib T6 extended rotated and side bent left L1 flexed rotated and side bent right Sacrum right on right       Impression and Recommendations:     This case required medical decision making of moderate complexity.      Note: This dictation was prepared with Dragon dictation along with smaller phrase technology. Any transcriptional errors that result from this process are  unintentional.

## 2017-06-01 NOTE — Patient Instructions (Signed)
Always good to see you  Ice is your friend.  Keep up with the vitamins  Stay active I am impressed See em again in 6 weeks and we will consider labs again

## 2017-06-01 NOTE — Assessment & Plan Note (Signed)
Decision today to treat with OMT was based on Physical Exam  After verbal consent patient was treated with HVLA, ME, FPR techniques in cervical, thoracic, rib areas  Patient tolerated the procedure well with improvement in symptoms  Patient given exercises, stretches and lifestyle modifications  See medications in patient instructions if given  Patient will follow up in 12 weeks 

## 2017-07-15 ENCOUNTER — Encounter: Payer: Self-pay | Admitting: Family Medicine

## 2017-07-15 ENCOUNTER — Ambulatory Visit (INDEPENDENT_AMBULATORY_CARE_PROVIDER_SITE_OTHER): Payer: BLUE CROSS/BLUE SHIELD | Admitting: Family Medicine

## 2017-07-15 VITALS — BP 100/70 | HR 67 | Ht 64.0 in | Wt 137.0 lb

## 2017-07-15 DIAGNOSIS — G2589 Other specified extrapyramidal and movement disorders: Secondary | ICD-10-CM | POA: Diagnosis not present

## 2017-07-15 DIAGNOSIS — M999 Biomechanical lesion, unspecified: Secondary | ICD-10-CM

## 2017-07-15 NOTE — Assessment & Plan Note (Signed)
Decision today to treat with OMT was based on Physical Exam  After verbal consent patient was treated with HVLA, ME, FPR techniques in cervical, thoracic,ib lumbar and sacral areas  Patient tolerated the procedure well with improvement in symptoms  Patient given exercises, stretches and lifestyle modifications  See medications in patient instructions if given  Patient will follow up in 6-8 weeks

## 2017-07-15 NOTE — Assessment & Plan Note (Signed)
Patient does have some significant dyskinesia. Given different encourage patient to do the ergonomics. We discussed which activities, as well as continuing to stay active. Patient will come back and see me again in 6-8 weeks.

## 2017-07-15 NOTE — Patient Instructions (Signed)
Good to see you  Keep being active  Ice is your friend Lets hold on the labs See me again in 6-8 weeks

## 2017-07-15 NOTE — Progress Notes (Signed)
  [name] [place], [place] [address] Phone: [phone] Subjective:    I'm seeing this patient by the request  of:    CC:   [id number]  Kelsey [name] is a 31 y.o. female coming in with complaint of tight hips. Would like to know better stretches for her hips. Feels hip "moving" and "popping" during burpies.   Onset-  Location Duration-  Character- Aggravating factors- Reliving factors-  Therapies tried-  Severity-     Past Medical History:  Diagnosis Date  . Abnormal pap    ASC-US   . History of HPV infection   . Vulvar lesion    Past Surgical History:  Procedure Laterality Date  . KNEE SURGERY     Social History   Social History  . Marital status: Single    Spouse name: N/A  . Number of children: N/A  . Years of education: N/A   Social History Main Topics  . Smoking status: Never Smoker  . Smokeless tobacco: Never Used  . Alcohol use Yes     Comment: socially not while pregnant  . Drug use: No  . Sexual activity: Yes    Partners: Male    Birth control/ protection: None   Other Topics Concern  . Not on file   Social History Narrative  . No narrative on file   No Known Allergies Family History  Problem Relation Age of Onset  . Hyperlipidemia Father   . Hypertension Father   . Cancer Maternal Grandfather   . Arthritis Paternal Grandmother      Past medical history, social, surgical and family history all reviewed in electronic medical record.  No pertanent information unless stated regarding to the chief complaint.   Review of Systems:Review of systems updated and as accurate as of 07/15/17  No headache, visual changes, nausea, vomiting, diarrhea, constipation, dizziness, abdominal pain, skin rash, fevers, chills, night sweats, weight loss, swollen lymph nodes, body aches, joint swelling, muscle aches, chest pain, shortness of breath, mood changes.   Objective  unknown if currently  breastfeeding. Systems examined below as of 07/15/17   General: No apparent distress alert and oriented x3 mood and affect normal, dressed appropriately.  HEENT: Pupils equal, extraocular movements intact  Respiratory: Patient's speak in full sentences and does not appear short of breath  Cardiovascular: No lower extremity edema, non tender, no erythema  Skin: Warm dry intact with no signs of infection or rash on extremities or on axial skeleton.  Abdomen: Soft nontender  Neuro: Cranial nerves II through XII are intact, neurovascularly intact in all extremities with 2+ DTRs and 2+ pulses.  Lymph: No lymphadenopathy of posterior or anterior cervical chain or axillae bilaterally.  Gait normal with good balance and coordination.  MSK:  Non tender with full range of motion and good stability and symmetric strength and tone of shoulders, elbows, wrist, hip, knee and ankles bilaterally.     Impression and Recommendations:     This case required medical decision making of moderate complexity.      Note: This dictation was prepared with Dragon dictation along with smaller phrase technology. Any transcriptional errors that result from this process are unintentional.

## 2017-07-15 NOTE — Progress Notes (Signed)
[name] [address], [place] [address] Phone: [phone] Subjective:    CC: Mid back pain  [id number]  Kelsey [name] is a 31 y.o. female coming in with complaint of mid back pain. 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 Has some tightness of the hip but nothing severe. Patient is trying to be active still. Patient has been very busy with work size not been working out as regularly when she does she has not been doing her stretches. Also being a little more stress     Past Medical History:  Diagnosis Date  . Abnormal pap    ASC-US   . History of HPV infection   . Vulvar lesion    Past Surgical History:  Procedure Laterality Date  . KNEE SURGERY     Social History   Social History  . Marital status: Single    Spouse name: N/A  . Number of children: N/A  . Years of education: N/A   Social History Main Topics  . Smoking status: Never Smoker  . Smokeless tobacco: Never Used  . Alcohol use Yes     Comment: socially not while pregnant  . Drug use: No  . Sexual activity: Yes    Partners: Male    Birth control/ protection: None   Other Topics Concern  . None   Social History Narrative  . None   No Known Allergies Family History  Problem Relation Age of Onset  . Hyperlipidemia Father   . Hypertension Father   . Cancer Maternal Grandfather   . Arthritis Paternal Grandmother      Past medical history, social, surgical and family history all reviewed in electronic medical record.  No pertanent information unless stated regarding to the chief complaint.   Review of Systems:Review of systems updated and as accurate as of 07/15/17  No headache, visual changes, nausea, vomiting, diarrhea, constipation, dizziness, abdominal pain, skin rash, fevers, chills, night sweats, weight loss, swollen lymph nodes, body aches, joint swelling,  chest pain, shortness of  breath, mood changes. Positive mild muscle aches  Objective  Blood pressure 100/70, pulse 67, height 5\' 4"  (1.626 m), weight 137 lb (62.1 kg), SpO2 98 %, unknown if currently breastfeeding. Systems examined below as of 07/15/17   General: No apparent distress alert and oriented x3 mood and affect normal, dressed appropriately.  HEENT: Pupils equal, extraocular movements intact  Respiratory: Patient's speak in full sentences and does not appear short of breath  Cardiovascular: No lower extremity edema, non tender, no erythema  Skin: Warm dry intact with no signs of infection or rash on extremities or on axial skeleton.  Abdomen: Soft nontender  Neuro: Cranial nerves II through XII are intact, neurovascularly intact in all extremities with 2+ DTRs and 2+ pulses.  Lymph: No lymphadenopathy of posterior or anterior cervical chain or axillae bilaterally.  Gait normal with good balance and coordination.  MSK:  Non tender with full range of motion and good stability and symmetric strength and tone of shoulders, elbows, wrist, hip, knee and ankles bilaterally.  Back Exam:  Inspection: Unremarkable  Motion: Flexion 45 deg, Extension 25 deg, Side Bending to 45 deg bilaterally,  Rotation to 45 deg bilaterally  SLR laying: Negative  XSLR laying: Negative  Palpable tenderness: None. FABER: negative. Sensory change: Gross sensation intact to all lumbar and sacral dermatomes.  Reflexes: 2+ at both patellar tendons, 2+ at achilles  tendons, Babinski's downgoing.  Strength at foot  Plantar-flexion: 5/5 Dorsi-flexion: 5/5 Eversion: 5/5 Inversion: 5/5  Leg strength  Quad: 5/5 Hamstring: 5/5 Hip flexor: 5/5 mild tightness noted Hip abductors: 5/5  Gait unremarkable. Mild increasing tightness of the right trapezius  Osteopathic findings C2 flexed rotated and side bent right C4 flexed rotated and side bent left C7 flexed rotated and side bent left T3 extended rotated and side bent right inhaled third  rib T11 extended rotated and side bent left L3 flexed rotated and side bent right Sacrum right on right    Impression and Recommendations:     This case required medical decision making of moderate complexity.      Note: This dictation was prepared with Dragon dictation along with smaller phrase technology. Any transcriptional errors that result from this process are unintentional.

## 2017-07-22 DIAGNOSIS — E559 Vitamin D deficiency, unspecified: Secondary | ICD-10-CM | POA: Diagnosis not present

## 2017-07-22 DIAGNOSIS — R6882 Decreased libido: Secondary | ICD-10-CM | POA: Diagnosis not present

## 2017-07-22 DIAGNOSIS — R5383 Other fatigue: Secondary | ICD-10-CM | POA: Diagnosis not present

## 2017-08-20 DIAGNOSIS — R5383 Other fatigue: Secondary | ICD-10-CM | POA: Diagnosis not present

## 2017-08-20 DIAGNOSIS — R6882 Decreased libido: Secondary | ICD-10-CM | POA: Diagnosis not present

## 2017-09-09 ENCOUNTER — Ambulatory Visit: Payer: BLUE CROSS/BLUE SHIELD | Admitting: Family Medicine

## 2017-10-07 ENCOUNTER — Ambulatory Visit: Payer: BLUE CROSS/BLUE SHIELD | Admitting: Family Medicine

## 2017-10-13 ENCOUNTER — Ambulatory Visit: Payer: BLUE CROSS/BLUE SHIELD | Admitting: Family Medicine

## 2017-10-15 NOTE — Progress Notes (Signed)
[name] [address], [place] [address] Phone: [phone] Subjective:     CC: Back pain follow-up  [id number]  Kelsey [name] is a 31 y.o. female coming in with complaint of mid back pain.  Found to have more of a scapular dyskinesia.  Attempted osteopathic manipulation.  Was making improvement with home exercises, over-the-counter medications.  Patient states that she had been doing well but that she has had an increase in pain over the past 2 weeks.        Past Medical History:  Diagnosis Date  . Abnormal pap    ASC-US   . History of HPV infection   . Vulvar lesion    Past Surgical History:  Procedure Laterality Date  . KNEE SURGERY     Social History   Socioeconomic History  . Marital status: Single    Spouse name: None  . Number of children: None  . Years of education: None  . Highest education level: None  Social Needs  . Financial resource strain: None  . Food insecurity - worry: None  . Food insecurity - inability: None  . Transportation needs - medical: None  . Transportation needs - non-medical: None  Occupational History  . None  Tobacco Use  . Smoking status: Never Smoker  . Smokeless tobacco: Never Used  Substance and Sexual Activity  . Alcohol use: Yes    Comment: socially not while pregnant  . Drug use: No  . Sexual activity: Yes    Partners: Male    Birth control/protection: None  Other Topics Concern  . None  Social History Narrative  . None   No Known Allergies Family History  Problem Relation Age of Onset  . Hyperlipidemia Father   . Hypertension Father   . Cancer Maternal Grandfather   . Arthritis Paternal Grandmother      Past medical history, social, surgical and family history all reviewed in electronic medical record.  No pertanent information unless stated regarding to the chief complaint.   Review of Systems:Review of systems updated and as accurate as of 10/16/17  No  headache, visual changes, nausea, vomiting, diarrhea, constipation, dizziness, abdominal pain, skin rash, fevers, chills, night sweats, weight loss, swollen lymph nodes, body aches, joint swelling, muscle aches, chest pain, shortness of breath, mood changes.   Objective  Blood pressure 108/68, pulse 73, height 5' 4.5" (1.638 m), weight 138 lb (62.6 kg), SpO2 98 %, unknown if currently breastfeeding. Systems examined below as of 10/16/17   General: No apparent distress alert and oriented x3 mood and affect normal, dressed appropriately.  HEENT: Pupils equal, extraocular movements intact  Respiratory: Patient's speak in full sentences and does not appear short of breath  Cardiovascular: No lower extremity edema, non tender, no erythema  Skin: Warm dry intact with no signs of infection or rash on extremities or on axial skeleton.  Abdomen: Soft nontender  Neuro: Cranial nerves II through XII are intact, neurovascularly intact in all extremities with 2+ DTRs and 2+ pulses.  Lymph: No lymphadenopathy of posterior or anterior cervical chain or axillae bilaterally.  Gait normal with good balance and coordination.  MSK:  Non tender with full range of motion and good stability and symmetric strength and tone of shoulders, elbows, wrist, hip, knee and ankles bilaterally.  Back Exam:  Inspection: Unremarkable  Motion: Flexion 40 deg, Extension 25 deg, Side Bending to 35 deg bilaterally,  Rotation to 35 deg bilaterally  SLR laying: Negative  XSLR laying: Negative  Palpable tenderness: TTP mostly in the TRL junction on the right . FABER: Positive right. Sensory change: Gross sensation intact to all lumbar and sacral dermatomes.  Reflexes: 2+ at both patellar tendons, 2+ at achilles tendons, Babinski's downgoing.  Strength at foot  Plantar-flexion: 5/5 Dorsi-flexion: 5/5 Eversion: 5/5 Inversion: 5/5  Leg strength  Quad: 5/5 Hamstring: 5/5 Hip flexor: 5/5 Hip abductors: 5/5  Gait  unremarkable.  Osteopathic findings C2 flexed rotated and side bent right T3 extended rotated and side bent right inhaled third rib T6 extended rotated and side bent left L2 flexed rotated and side bent right Sacrum right on right     Impression and Recommendations:     This case required medical decision making of moderate complexity.      Note: This dictation was prepared with Dragon dictation along with smaller phrase technology. Any transcriptional errors that result from this process are unintentional.

## 2017-10-16 ENCOUNTER — Ambulatory Visit: Payer: BLUE CROSS/BLUE SHIELD | Admitting: Family Medicine

## 2017-10-16 ENCOUNTER — Encounter: Payer: Self-pay | Admitting: Family Medicine

## 2017-10-16 VITALS — BP 108/68 | HR 73 | Ht 64.5 in | Wt 138.0 lb

## 2017-10-16 DIAGNOSIS — M999 Biomechanical lesion, unspecified: Secondary | ICD-10-CM | POA: Diagnosis not present

## 2017-10-16 DIAGNOSIS — G2589 Other specified extrapyramidal and movement disorders: Secondary | ICD-10-CM

## 2017-10-16 NOTE — Assessment & Plan Note (Signed)
Patient does have more scapular dyskinesia.  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  Has responded fairly well to osteopathic manipulation.  We discussed icing regimen and home exercises.  We discussed which activities to do which wants to avoid.  Follow-up with me again in 2 months

## 2017-10-16 NOTE — Patient Instructions (Signed)
Great to see you  You are doing great overall  Try to stand more at work  We coud try trigger point injections if you need See em again in 6 weeks Happy holidays!

## 2017-10-16 NOTE — Assessment & Plan Note (Signed)
Decision today to treat with OMT was based on Physical Exam  After verbal consent patient was treated with HVLA, ME, FPR techniques in cervical, thoracic, rib areas  Patient tolerated the procedure well with improvement in symptoms  Patient given exercises, stretches and lifestyle modifications  See medications in patient instructions if given  Patient will follow up in 8 weeks 

## 2017-11-25 ENCOUNTER — Ambulatory Visit: Payer: BLUE CROSS/BLUE SHIELD | Admitting: Family Medicine

## 2017-11-25 ENCOUNTER — Encounter: Payer: Self-pay | Admitting: Family Medicine

## 2017-11-25 VITALS — BP 104/74 | HR 58 | Ht 64.5 in | Wt 139.0 lb

## 2017-11-25 DIAGNOSIS — M999 Biomechanical lesion, unspecified: Secondary | ICD-10-CM

## 2017-11-25 DIAGNOSIS — G2589 Other specified extrapyramidal and movement disorders: Secondary | ICD-10-CM | POA: Diagnosis not present

## 2017-11-25 NOTE — Assessment & Plan Note (Signed)
Decision today to treat with OMT was based on Physical Exam  After verbal consent patient was treated with HVLA, ME, FPR techniques in cervical, thoracic, rib, lumbar and sacral areas  Patient tolerated the procedure well with improvement in symptoms  Patient given exercises, stretches and lifestyle modifications  See medications in patient instructions if given  Patient will follow up in 4 weeks 

## 2017-11-25 NOTE — Patient Instructions (Signed)
Good to see you  I am always impressed b you Keep it up  Change deadlift a little.  Go shoe shopping ;) More omega 3 then omega 6  See me again in 6-8 weeks

## 2017-11-25 NOTE — Progress Notes (Signed)
[name] [address] [place], [place] [address] Phone: [phone] Subjective:     CC: Right neck and upper back pain  [id number]  Kelsey [name] is a 32 y.o. female coming in with complaint of right upper neck and back pain.  Seen previously.  Has more of a scapular dyskinesia and has responded fairly well to osteopathic manipulation.  Patient was having some increasing tightness of the scapular region and the trapezius last time.  Patient was to change in ergonomics.  Patient states that she is doing ok. She has been standing more at work. She said that this has helped her back but hurt her knees.      Past Medical History:  Diagnosis Date  . Abnormal pap    ASC-US   . History of HPV infection   . Vulvar lesion    Past Surgical History:  Procedure Laterality Date  . KNEE SURGERY     Social History   Socioeconomic History  . Marital status: Single    Spouse name: None  . Number of children: None  . Years of education: None  . Highest education level: None  Social Needs  . Financial resource strain: None  . Food insecurity - worry: None  . Food insecurity - inability: None  . Transportation needs - medical: None  . Transportation needs - non-medical: None  Occupational History  . None  Tobacco Use  . Smoking status: Never Smoker  . Smokeless tobacco: Never Used  Substance and Sexual Activity  . Alcohol use: Yes    Comment: socially not while pregnant  . Drug use: No  . Sexual activity: Yes    Partners: Male    Birth control/protection: None  Other Topics Concern  . None  Social History Narrative  . None   No Known Allergies Family History  Problem Relation Age of Onset  . Hyperlipidemia Father   . Hypertension Father   . Cancer Maternal Grandfather   . Arthritis Paternal Grandmother      Past medical history, social, surgical and family history all reviewed in electronic medical record.  No pertanent information  unless stated regarding to the chief complaint.   Review of Systems:Review of systems updated and as accurate as of 11/25/17  No headache, visual changes, nausea, vomiting, diarrhea, constipation, dizziness, abdominal pain, skin rash, fevers, chills, night sweats, weight loss, swollen lymph nodes, body aches, joint swelling,  chest pain, shortness of breath, mood changes.  Mild positive muscle aches  Objective  Blood pressure 104/74, pulse (!) 58, height 5' 4.5" (1.638 m), weight 139 lb (63 kg), SpO2 98 %, unknown if currently breastfeeding. Systems examined below as of 11/25/17   General: No apparent distress alert and oriented x3 mood and affect normal, dressed appropriately.  HEENT: Pupils equal, extraocular movements intact  Respiratory: Patient's speak in full sentences and does not appear short of breath  Cardiovascular: No lower extremity edema, non tender, no erythema  Skin: Warm dry intact with no signs of infection or rash on extremities or on axial skeleton.  Abdomen: Soft nontender  Neuro: Cranial nerves II through XII are intact, neurovascularly intact in all extremities with 2+ DTRs and 2+ pulses.  Lymph: No lymphadenopathy of posterior or anterior cervical chain or axillae bilaterally.  Gait normal with good balance and coordination.  MSK:  Non tender with full range of motion and good stability and symmetric strength and tone of shoulders, elbows, wrist, hip, knee and ankles bilaterally.  Back Exam:  Inspection: Mild loss of lordosis Motion: Flexion 45 deg, Extension 35 deg, Side Bending to 45 deg bilaterally,  Rotation to 45 deg bilaterally  SLR laying: Negative  XSLR laying: Negative  Palpable tenderness: Tender to palpation in the lumbosacral areas right greater than left.  More tightness over the trapezius as well.  FABER: Mild positive right. Sensory change: Gross sensation intact to all lumbar and sacral dermatomes.  Reflexes: 2+ at both patellar tendons, 2+ at  achilles tendons, Babinski's downgoing.  Strength at foot  Plantar-flexion: 5/5 Dorsi-flexion: 5/5 Eversion: 5/5 Inversion: 5/5  Leg strength  Quad: 5/5 Hamstring: 5/5 Hip flexor: 5/5 Hip abductors: 5/5  Gait unremarkable.    Impression and Recommendations:     This case required medical decision making of moderate complexity.      Note: This dictation was prepared with Dragon dictation along with smaller phrase technology. Any transcriptional errors that result from this process are unintentional.

## 2017-11-25 NOTE — Assessment & Plan Note (Signed)
Mild scapular dyskinesis.  We discussed icing regimen, home exercise, to try to work on posture and ergonomics still.  Has responded fairly well to osteopathic manipulation and we will have patient follow-up again in 6-8 weeks

## 2018-01-04 DIAGNOSIS — Z01419 Encounter for gynecological examination (general) (routine) without abnormal findings: Secondary | ICD-10-CM | POA: Diagnosis not present

## 2018-01-04 DIAGNOSIS — Z6823 Body mass index (BMI) 23.0-23.9, adult: Secondary | ICD-10-CM | POA: Diagnosis not present

## 2018-01-04 DIAGNOSIS — R87619 Unspecified abnormal cytological findings in specimens from cervix uteri: Secondary | ICD-10-CM | POA: Diagnosis not present

## 2018-01-08 DIAGNOSIS — D2262 Melanocytic nevi of left upper limb, including shoulder: Secondary | ICD-10-CM | POA: Diagnosis not present

## 2018-01-08 DIAGNOSIS — D1801 Hemangioma of skin and subcutaneous tissue: Secondary | ICD-10-CM | POA: Diagnosis not present

## 2018-01-08 DIAGNOSIS — D225 Melanocytic nevi of trunk: Secondary | ICD-10-CM | POA: Diagnosis not present

## 2018-01-08 DIAGNOSIS — D2261 Melanocytic nevi of right upper limb, including shoulder: Secondary | ICD-10-CM | POA: Diagnosis not present

## 2018-01-27 ENCOUNTER — Ambulatory Visit: Payer: BLUE CROSS/BLUE SHIELD | Admitting: Family Medicine

## 2018-01-27 DIAGNOSIS — Z0289 Encounter for other administrative examinations: Secondary | ICD-10-CM

## 2018-01-27 NOTE — Progress Notes (Deleted)
[name] [place] [place], [place] [address] Phone: [phone] Subjective:    I'm seeing this patient by the request  of:    CC:   [id number]  Kelsey [name] is a 32 y.o. female coming in with complaint of ***  Onset-  Location Duration-  Character- Aggravating factors- Reliving factors-  Therapies tried-  Severity-     Past Medical History:  Diagnosis Date  . Abnormal pap    ASC-US   . History of HPV infection   . Vulvar lesion    Past Surgical History:  Procedure Laterality Date  . KNEE SURGERY     Social History   Socioeconomic History  . Marital status: Single    Spouse name: Not on file  . Number of children: Not on file  . Years of education: Not on file  . Highest education level: Not on file  Occupational History  . Not on file  Social Needs  . Financial resource strain: Not on file  . Food insecurity:    Worry: Not on file    Inability: Not on file  . Transportation needs:    Medical: Not on file    Non-medical: Not on file  Tobacco Use  . Smoking status: Never Smoker  . Smokeless tobacco: Never Used  Substance and Sexual Activity  . Alcohol use: Yes    Comment: socially not while pregnant  . Drug use: No  . Sexual activity: Yes    Partners: Male    Birth control/protection: None  Lifestyle  . Physical activity:    Days per week: Not on file    Minutes per session: Not on file  . Stress: Not on file  Relationships  . Social connections:    Talks on phone: Not on file    Gets together: Not on file    Attends religious service: Not on file    Active member of club or organization: Not on file    Attends meetings of clubs or organizations: Not on file    Relationship status: Not on file  Other Topics Concern  . Not on file  Social History Narrative  . Not on file   No Known Allergies Family History  Problem Relation Age of Onset  . Hyperlipidemia Father   . Hypertension Father   .  Cancer Maternal Grandfather   . Arthritis Paternal Grandmother      Past medical history, social, surgical and family history all reviewed in electronic medical record.  No pertanent information unless stated regarding to the chief complaint.   Review of Systems:Review of systems updated and as accurate as of 01/27/18  No headache, visual changes, nausea, vomiting, diarrhea, constipation, dizziness, abdominal pain, skin rash, fevers, chills, night sweats, weight loss, swollen lymph nodes, body aches, joint swelling, muscle aches, chest pain, shortness of breath, mood changes.   Objective  unknown if currently breastfeeding. Systems examined below as of 01/27/18   General: No apparent distress alert and oriented x3 mood and affect normal, dressed appropriately.  HEENT: Pupils equal, extraocular movements intact  Respiratory: Patient's speak in full sentences and does not appear short of breath  Cardiovascular: No lower extremity edema, non tender, no erythema  Skin: Warm dry intact with no signs of infection or rash on extremities or on axial skeleton.  Abdomen: Soft nontender  Neuro: Cranial nerves II through XII are intact, neurovascularly intact in all extremities with 2+ DTRs and 2+ pulses.  Lymph: No lymphadenopathy  of posterior or anterior cervical chain or axillae bilaterally.  Gait normal with good balance and coordination.  MSK:  Non tender with full range of motion and good stability and symmetric strength and tone of shoulders, elbows, wrist, hip, knee and ankles bilaterally.     Impression and Recommendations:     This case required medical decision making of moderate complexity.      Note: This dictation was prepared with Dragon dictation along with smaller phrase technology. Any transcriptional errors that result from this process are unintentional.

## 2018-01-31 DIAGNOSIS — S93402A Sprain of unspecified ligament of left ankle, initial encounter: Secondary | ICD-10-CM | POA: Diagnosis not present

## 2018-02-10 ENCOUNTER — Encounter: Payer: Self-pay | Admitting: Family Medicine

## 2018-02-10 ENCOUNTER — Ambulatory Visit: Payer: BLUE CROSS/BLUE SHIELD | Admitting: Family Medicine

## 2018-02-10 ENCOUNTER — Ambulatory Visit: Payer: Self-pay

## 2018-02-10 VITALS — BP 118/78 | HR 63 | Ht 64.5 in | Wt 135.0 lb

## 2018-02-10 DIAGNOSIS — M25572 Pain in left ankle and joints of left foot: Secondary | ICD-10-CM

## 2018-02-10 DIAGNOSIS — M999 Biomechanical lesion, unspecified: Secondary | ICD-10-CM | POA: Diagnosis not present

## 2018-02-10 DIAGNOSIS — S134XXA Sprain of ligaments of cervical spine, initial encounter: Secondary | ICD-10-CM | POA: Diagnosis not present

## 2018-02-10 MED ORDER — VITAMIN D (ERGOCALCIFEROL) 1.25 MG (50000 UNIT) PO CAPS: 50000.0000 [IU] | ORAL_CAPSULE | ORAL | 0 refills | Status: DC

## 2018-02-10 NOTE — Patient Instructions (Addendum)
Good to see you  pennsaid pinkie amount topically 2 times daily as needed.   Ice 20 minutes 2 times daily. Usually after activity and before bed. Once weekly vitamin D  Give it another 5 days til running Range ogf motion exercises See em again in 2 weeks

## 2018-02-10 NOTE — Assessment & Plan Note (Signed)
Decision today to treat with OMT was based on Physical Exam  After verbal consent patient was treated with HVLA, ME, FPR techniques in cervical, thoracic, rib, lumbar and sacral areas  Patient tolerated the procedure well with improvement in symptoms  Patient given exercises, stretches and lifestyle modifications  See medications in patient instructions if given  Patient will follow up in 2 weeks

## 2018-02-10 NOTE — Assessment & Plan Note (Signed)
From the low back.  Patient was given the suggestions of different medications including prednisone as well as ibuprofen.  Patient declined that and wants to try to do it naturally.  Home exercises given, icing regimen, which activities to doing which wants to avoid.  Patient will increase activity as tolerated.
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
## 2018-02-23 ENCOUNTER — Ambulatory Visit: Payer: BLUE CROSS/BLUE SHIELD | Admitting: Family Medicine

## 2018-02-23 ENCOUNTER — Encounter: Payer: Self-pay | Admitting: Family Medicine

## 2018-02-23 VITALS — BP 102/62 | HR 59 | Ht 64.5 in | Wt 139.4 lb

## 2018-02-23 DIAGNOSIS — M999 Biomechanical lesion, unspecified: Secondary | ICD-10-CM | POA: Diagnosis not present

## 2018-02-23 DIAGNOSIS — M25572 Pain in left ankle and joints of left foot: Secondary | ICD-10-CM

## 2018-02-23 DIAGNOSIS — S134XXA Sprain of ligaments of cervical spine, initial encounter: Secondary | ICD-10-CM | POA: Diagnosis not present

## 2018-02-23 NOTE — Patient Instructions (Signed)
Good to see you  I am sorry a little late today  Stay active  No jumping still another 2 weeks Run very careful  See me again in 3-4 weeks

## 2018-02-23 NOTE — Assessment & Plan Note (Signed)
Decision today to treat with OMT was based on Physical Exam  After verbal consent patient was treated with HVLA, ME, FPR techniques in cervical, thoracic, rib, lumbar and sacral areas  Patient tolerated the procedure well with improvement in symptoms  Patient given exercises, stretches and lifestyle modifications  See medications in patient instructions if given  Patient will follow up in 12 weeks 

## 2018-02-23 NOTE — Assessment & Plan Note (Signed)
High ankle sprain seems to be nearly completely resolved at this time.  Discussed icing regimen and home exercises.  Discussed which activities to do which wants to avoid.  Patient is to increase activity slowly over the course the next several days.  Patient if any worsening symptoms he will call me.  Follow-up again in 4 weeks

## 2018-02-23 NOTE — Assessment & Plan Note (Signed)
Improvement.  Still not back to patient's baseline.  Continue all other regimens at this point including home exercise.  Started osteopathic manipulation.  Follow-up again in 4 weeks
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
## 2018-03-16 ENCOUNTER — Encounter: Payer: Self-pay | Admitting: Family Medicine

## 2018-03-16 ENCOUNTER — Ambulatory Visit: Payer: BLUE CROSS/BLUE SHIELD | Admitting: Family Medicine

## 2018-03-16 VITALS — BP 108/70 | HR 61 | Ht 64.0 in | Wt 142.0 lb

## 2018-03-16 DIAGNOSIS — S93439A Sprain of tibiofibular ligament of unspecified ankle, initial encounter: Secondary | ICD-10-CM | POA: Diagnosis not present

## 2018-03-16 DIAGNOSIS — M999 Biomechanical lesion, unspecified: Secondary | ICD-10-CM

## 2018-03-16 DIAGNOSIS — M25572 Pain in left ankle and joints of left foot: Secondary | ICD-10-CM | POA: Diagnosis not present

## 2018-03-16 NOTE — Assessment & Plan Note (Signed)
Decision today to treat with OMT was based on Physical Exam  After verbal consent patient was treated with HVLA, ME, FPR techniques in cervical, thoracic, lumbar and sacral areas  Patient tolerated the procedure well with improvement in symptoms  Patient given exercises, stretches and lifestyle modifications  See medications in patient instructions if given  Patient will follow up in 4-6 weeks 

## 2018-03-16 NOTE — Assessment & Plan Note (Signed)
Left ankle pain is significant for more of I am high ankle sprain.  Will be sent to formal physical therapy with her continuing to have some difficulty.  Given a lace up brace that I think will be beneficial for some stability with her working out.  We discussed icing regimen and encourage her to continue home exercises in the interim.  Follow-up again in 4 weeks

## 2018-03-16 NOTE — Patient Instructions (Signed)
Good to see you  Kelsey [name] is your friend.  Continue to be active Brace with working out.  PT will be calling you  Continue all vitamins See me again in 4 week

## 2018-03-24 DIAGNOSIS — S93439A Sprain of tibiofibular ligament of unspecified ankle, initial encounter: Secondary | ICD-10-CM | POA: Diagnosis not present

## 2018-04-07 DIAGNOSIS — S93439A Sprain of tibiofibular ligament of unspecified ankle, initial encounter: Secondary | ICD-10-CM | POA: Diagnosis not present
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
## 2018-04-14 ENCOUNTER — Ambulatory Visit: Payer: BLUE CROSS/BLUE SHIELD | Admitting: Family Medicine

## 2018-04-14 ENCOUNTER — Ambulatory Visit (INDEPENDENT_AMBULATORY_CARE_PROVIDER_SITE_OTHER)
Admission: RE | Admit: 2018-04-14 | Discharge: 2018-04-14 | Disposition: A | Discharge status: Home / Self Care | Payer: BLUE CROSS/BLUE SHIELD | Source: Ambulatory Visit | Attending: Family Medicine | Admitting: Family Medicine

## 2018-04-14 ENCOUNTER — Encounter: Payer: Self-pay | Admitting: Family Medicine

## 2018-04-14 VITALS — BP 106/76 | HR 55 | Ht 64.0 in | Wt 139.0 lb

## 2018-04-14 DIAGNOSIS — M999 Biomechanical lesion, unspecified: Secondary | ICD-10-CM | POA: Diagnosis not present

## 2018-04-14 DIAGNOSIS — S134XXD Sprain of ligaments of cervical spine, subsequent encounter: Secondary | ICD-10-CM

## 2018-04-14 DIAGNOSIS — S93439A Sprain of tibiofibular ligament of unspecified ankle, initial encounter: Secondary | ICD-10-CM | POA: Diagnosis not present

## 2018-04-14 DIAGNOSIS — M25572 Pain in left ankle and joints of left foot: Secondary | ICD-10-CM

## 2018-04-14 DIAGNOSIS — G8929 Other chronic pain: Secondary | ICD-10-CM | POA: Diagnosis not present

## 2018-04-14 DIAGNOSIS — S93402A Sprain of unspecified ligament of left ankle, initial encounter: Secondary | ICD-10-CM | POA: Diagnosis not present

## 2018-04-14 NOTE — Assessment & Plan Note (Signed)
Decision today to treat with OMT was based on Physical Exam  After verbal consent patient was treated with HVLA, ME, FPR techniques in cervical, thoracic, rib, lumbar and sacral areas  Patient tolerated the procedure well with improvement in symptoms  Patient given exercises, stretches and lifestyle modifications  See medications in patient instructions if given  Patient will follow up in 4-6 weeks 

## 2018-04-14 NOTE — Assessment & Plan Note (Signed)
Has some swelling.  Patient is not 100%.  X-rays ordered today to further evaluate for any type patellar dome injury that could be contributing to some of the continued mild swelling.  Patient is able to ambulate and is working out fairly regularly.  Follow-up again in 4 weeks

## 2018-04-14 NOTE — Patient Instructions (Signed)
Good to see you  Xray downstairs See me again in 4-6 weeks

## 2018-04-14 NOTE — Assessment & Plan Note (Signed)
Improving overall.  Still some mild stiffness.  Has done formal physical therapy and seems to be improving.  Discussed icing regimen and home exercises.  Still responds well to osteopathic manipulation.  Follow-up again in 4 to 6 weeks.  Not at maximal medical improvement yet.

## 2018-04-20 ENCOUNTER — Other Ambulatory Visit: Payer: Self-pay

## 2018-04-20 ENCOUNTER — Encounter: Payer: Self-pay | Admitting: Family Medicine

## 2018-04-20 DIAGNOSIS — M25572 Pain in left ankle and joints of left foot: Principal | ICD-10-CM

## 2018-04-20 DIAGNOSIS — G8929 Other chronic pain: Secondary | ICD-10-CM

## 2018-04-30 DIAGNOSIS — S93439A Sprain of tibiofibular ligament of unspecified ankle, initial encounter: Secondary | ICD-10-CM | POA: Diagnosis not present

## 2018-05-07 DIAGNOSIS — S93439A Sprain of tibiofibular ligament of unspecified ankle, initial encounter: Secondary | ICD-10-CM | POA: Diagnosis not present

## 2018-05-11 ENCOUNTER — Encounter: Payer: Self-pay | Admitting: Family Medicine

## 2018-05-11 NOTE — Progress Notes (Signed)
[name] [address] [place], [name] [address] Phone: [phone] Subjective:      CC: Left ankle, back and neck pain follow-up  [id number]  Kelsey [name] is a 32 y.o. female coming in with complaint of ankle pain. She has been doing physical therapy which she states is helping. She is stretching her calf more at therapy which has seemed to alleviate her pain. Pain was in the front of the ankle.  Patient initially had a syndesmosis injury as well as a bone contusion.  Feels like she is making some progress though overall.  States that there is times when she does not have much pain and it seems to worsen again.  Neck pain seems to be getting closer to her baseline.  Still a little more tightness than usual.  Has responded well to manipulation in the past.  Discussed icing regimen.  Has been doing it fairly regularly.     Past Medical History:  Diagnosis Date  . Abnormal pap    ASC-US   . History of HPV infection   . Vulvar lesion    Past Surgical History:  Procedure Laterality Date  . KNEE SURGERY     Social History   Socioeconomic History  . Marital status: Single    Spouse name: Not on file  . Number of children: Not on file  . Years of education: Not on file  . Highest education level: Not on file  Occupational History  . Not on file  Social Needs  . Financial resource strain: Not on file  . Food insecurity:    Worry: Not on file    Inability: Not on file  . Transportation needs:    Medical: Not on file    Non-medical: Not on file  Tobacco Use  . Smoking status: Never Smoker  . Smokeless tobacco: Never Used  Substance and Sexual Activity  . Alcohol use: Yes    Comment: socially not while pregnant  . Drug use: No  . Sexual activity: Yes    Partners: Male    Birth control/protection: None  Lifestyle  . Physical activity:    Days per week: Not on file    Minutes per session: Not on file  . Stress: Not on file    Relationships  . Social connections:    Talks on phone: Not on file    Gets together: Not on file    Attends religious service: Not on file    Active member of club or organization: Not on file    Attends meetings of clubs or organizations: Not on file    Relationship status: Not on file  Other Topics Concern  . Not on file  Social History Narrative  . Not on file   No Known Allergies Family History  Problem Relation Age of Onset  . Hyperlipidemia Father   . Hypertension Father   . Cancer Maternal Grandfather   . Arthritis Paternal Grandmother      Past medical history, social, surgical and family history all reviewed in electronic medical record.  No pertanent information unless stated regarding to the chief complaint.   Review of Systems:Review of systems updated and as accurate as of 05/11/18  No headache, visual changes, nausea, vomiting, diarrhea, constipation, dizziness, abdominal pain, skin rash, fevers, chills, night sweats, weight loss, swollen lymph nodes, body aches, joint swelling, muscle aches, chest pain, shortness of breath, mood changes.   Objective  unknown if currently breastfeeding. Systems examined  below as of 05/11/18   General: No apparent distress alert and oriented x3 mood and affect normal, dressed appropriately.  HEENT: Pupils equal, extraocular movements intact  Respiratory: Patient's speak in full sentences and does not appear short of breath  Cardiovascular: No lower extremity edema, non tender, no erythema  Skin: Warm dry intact with no signs of infection or rash on extremities or on axial skeleton.  Abdomen: Soft nontender  Neuro: Cranial nerves II through XII are intact, neurovascularly intact in all extremities with 2+ DTRs and 2+ pulses.  Lymph: No lymphadenopathy of posterior or anterior cervical chain or axillae bilaterally.  Gait normal with good balance and coordination.  MSK:  Non tender with full range of motion and good stability and  symmetric strength and tone of shoulders, elbows, wrist, hip, knee bilaterally.   Ankle: Left No visible erythema or swelling. Range of motion is full in all directions. Strength is 5/5 in all directions. Stable lateral and medial ligaments; squeeze test and kleiger test unremarkable; Talar dome nontender; No pain at base of 5th MT; No tenderness over cuboid; No tenderness over N spot or navicular prominence No tenderness on posterior aspects of lateral and medial malleolus No sign of peroneal tendon subluxations or tenderness to palpation Negative tarsal tunnel tinel's Able to walk 4 steps. Contralateral ankle unremarkable  Neck: Inspection still mild loss of lordosis. No palpable stepoffs. Negative Spurling's maneuver. Full neck range of motion Grip strength and sensation normal in bilateral hands Strength good C4 to T1 distribution No sensory change to C4 to T1 Negative Hoffman sign bilaterally Reflexes normal Tightness of the left trapezius  Osteopathic findings C2 flexed rotated and side bent right C4 flexed rotated and side bent left C6 flexed rotated and side bent left T3 extended rotated and side bent right inhaled third rib T9 extended rotated and side bent left L2 flexed rotated and side bent right Sacrum right on right  MSK US performed of: Left ankle This study was ordered, performed, and interpreted by Charlann Boxer D.O.  Foot/Ankle:   All structures visualized.   Talar dome unremarkable  Ankle mortise without effusion. Peroneus longus and brevis tendons unremarkable on long and transverse views without sheath effusions. Posterior tibialis, flexor hallucis longus, and flexor digitorum longus tendons unremarkable on long and transverse views without sheath effusions. Achilles tendon visualized along length of tendon and unremarkable on long and transverse views without sheath effusion. Anterior Talofibular Ligament and Calcaneofibular Ligaments unremarkable and  intact. Deltoid Ligament unremarkable and intact. Plantar fascia intact and without effusion, normal thickness. No increased doppler signal, cap sign, or thickening of tibial cortex. Power doppler signal normal.  IMPRESSION:  NORMAL ULTRASONOGRAPHIC EXAMINATION OF THE FOOT/ANKLE.     Impression and Recommendations:     This case required medical decision making of moderate complexity.      Note: This dictation was prepared with Dragon dictation along with smaller phrase technology. Any transcriptional errors that result from this process are unintentional.

## 2018-05-12 ENCOUNTER — Ambulatory Visit: Payer: BLUE CROSS/BLUE SHIELD | Admitting: Family Medicine

## 2018-05-12 ENCOUNTER — Encounter: Payer: Self-pay | Admitting: Family Medicine

## 2018-05-12 ENCOUNTER — Ambulatory Visit: Payer: Self-pay

## 2018-05-12 VITALS — BP 108/72 | HR 58 | Ht 64.0 in | Wt 142.0 lb

## 2018-05-12 DIAGNOSIS — M25572 Pain in left ankle and joints of left foot: Secondary | ICD-10-CM | POA: Diagnosis not present

## 2018-05-12 DIAGNOSIS — S93439A Sprain of tibiofibular ligament of unspecified ankle, initial encounter: Secondary | ICD-10-CM | POA: Diagnosis not present

## 2018-05-12 DIAGNOSIS — M999 Biomechanical lesion, unspecified: Secondary | ICD-10-CM | POA: Diagnosis not present

## 2018-05-12 DIAGNOSIS — G8929 Other chronic pain: Secondary | ICD-10-CM | POA: Diagnosis not present

## 2018-05-12 NOTE — Patient Instructions (Signed)
Good to see you  Ice is your friend  Arloa Koh push it  Get going in the gym but then cut back on PT  Can consider Dry needling See me again in 4 weeks

## 2018-05-12 NOTE — Assessment & Plan Note (Signed)
Decision today to treat with OMT was based on Physical Exam  After verbal consent patient was treated with HVLA, ME, FPR techniques in cervical, thoracic, rib, lumbar and sacral areas  Patient tolerated the procedure well with improvement in symptoms  Patient given exercises, stretches and lifestyle modifications  See medications in patient instructions if given  Patient will follow up in 4-6 weeks 

## 2018-05-12 NOTE — Assessment & Plan Note (Signed)
Normal overall if continuing pain then MRI to rule out talar dome injury

## 2018-05-26 DIAGNOSIS — S93439A Sprain of tibiofibular ligament of unspecified ankle, initial encounter: Secondary | ICD-10-CM | POA: Diagnosis not present
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
## 2018-06-08 ENCOUNTER — Encounter: Payer: Self-pay | Admitting: Family Medicine

## 2018-06-08 ENCOUNTER — Ambulatory Visit: Payer: BLUE CROSS/BLUE SHIELD | Admitting: Family Medicine

## 2018-06-08 VITALS — BP 104/70 | HR 55 | Ht 64.0 in | Wt 143.0 lb

## 2018-06-08 DIAGNOSIS — M25572 Pain in left ankle and joints of left foot: Secondary | ICD-10-CM | POA: Diagnosis not present

## 2018-06-08 DIAGNOSIS — M999 Biomechanical lesion, unspecified: Secondary | ICD-10-CM | POA: Diagnosis not present

## 2018-06-08 DIAGNOSIS — S134XXD Sprain of ligaments of cervical spine, subsequent encounter: Secondary | ICD-10-CM | POA: Diagnosis not present

## 2018-06-08 NOTE — Assessment & Plan Note (Signed)
Resolved at this time.  No significant changes.

## 2018-06-08 NOTE — Assessment & Plan Note (Signed)
Decision today to treat with OMT was based on Physical Exam  After verbal consent patient was treated with HVLA, ME, FPR techniques in cervical, thoracic, rib lumbar and sacral areas  Patient tolerated the procedure well with improvement in symptoms  Patient given exercises, stretches and lifestyle modifications  See medications in patient instructions if given  Patient will follow up in 8 weeks 

## 2018-06-08 NOTE — Assessment & Plan Note (Signed)
I believe that it is nearly fully resolved at this time.  Started osteopathic manipulation and doing well.  Patient will follow-up with me again in 2 months.  No significant change in management.  At medical maximal improvement

## 2018-06-08 NOTE — Patient Instructions (Signed)
Good to see you  Gave you a letter to close the case One knee down one knee up tilt pelvis forward.  Hands overhead then rotate to upper leg.  Hold 10 seconds, relax, repeat and do other side as well.  Very important when after being in a flexed position for a long amount of time.  Exercises on wall.  Heel and butt touching.  Raise leg 6 inches and hold 2 seconds.  Down slow for count of 4 seconds.  1 set of 30 reps daily on both sides.   Keep doing you  See me again in 2 months

## 2018-08-18 ENCOUNTER — Ambulatory Visit: Payer: BLUE CROSS/BLUE SHIELD | Admitting: Family Medicine

## 2018-08-31 NOTE — Progress Notes (Signed)
Kelsey [name], [place] [address] Phone: [phone] Subjective:   Kelsey [name], am serving as a scribe for Dr. Hulan Saas.    CC: Back and neck pain follow-up  [id number]  Kelsey [name] is a 32 y.o. female coming in with complaint of back and ankle pain. She states that her ankle pain has resolved. Is here for OMT as this helps manage her back pain. [name] new pain since last visit.  Patient states that there is some tightness more of the neck.  Seems to be in the right shoulder blade.  Rates the severity pain is 5 out of 10.        Past Medical History:  Diagnosis Date  . Abnormal pap    ASC-US   . History of HPV infection   . Vulvar lesion    Past Surgical History:  Procedure Laterality Date  . KNEE SURGERY     Social History   Socioeconomic History  . Marital status: Single    Spouse name: Not on file  . Number of children: Not on file  . Years of education: Not on file  . Highest education level: Not on file  Occupational History  . Not on file  Social Needs  . Financial resource strain: Not on file  . Food insecurity:    Worry: Not on file    Inability: Not on file  . Transportation needs:    Medical: Not on file    Non-medical: Not on file  Tobacco Use  . Smoking status: Never Smoker  . Smokeless tobacco: Never Used  Substance and Sexual Activity  . Alcohol use: Yes    Comment: socially not while pregnant  . Drug use: [name]  . Sexual activity: Yes    Partners: Male    Birth control/protection: None  Lifestyle  . Physical activity:    Days per week: Not on file    Minutes per session: Not on file  . Stress: Not on file  Relationships  . Social connections:    Talks on phone: Not on file    Gets together: Not on file    Attends religious service: Not on file    Active member of club or organization: Not on file    Attends meetings of clubs or organizations: Not on file    Relationship status:  Not on file  Other Topics Concern  . Not on file  Social History Narrative  . Not on file   [name] Known Allergies Family History  Problem Relation Age of Onset  . Hyperlipidemia Father   . Hypertension Father   . Cancer Maternal Grandfather   . Arthritis Paternal Grandmother         Current Outpatient Medications (Hematological):  .  vitamin B-12 (CYANOCOBALAMIN) 100 MCG tablet, Take 100 mcg by mouth daily.  Current Outpatient Medications (Other):  Marland Kitchen  Vitamin D, Ergocalciferol, (DRISDOL) 50000 units CAPS capsule, Take 1 capsule (50,000 Units total) by mouth every 7 (seven) days.    Past medical history, social, surgical and family history all reviewed in electronic medical record.  [name] pertanent information unless stated regarding to the chief complaint.   Review of Systems:  [name] headache, visual changes, nausea, vomiting, diarrhea, constipation, dizziness, abdominal pain, skin rash, fevers, chills, night sweats, weight loss, swollen lymph nodes, body aches, joint swelling, , chest pain, shortness of breath, mood changes.  Mild positive muscle aches  Objective  unknown if currently breastfeeding.  Systems examined below as of    General: [name] apparent distress alert and oriented x3 mood and affect normal, dressed appropriately.  HEENT: Pupils equal, extraocular movements intact  Respiratory: Patient's speak in full sentences and does not appear short of breath  Cardiovascular: [name] lower extremity edema, non tender, [name] erythema  Skin: Warm dry intact with [name] signs of infection or rash on extremities or on axial skeleton.  Abdomen: Soft nontender  Neuro: Cranial nerves II through XII are intact, neurovascularly intact in all extremities with 2+ DTRs and 2+ pulses.  Lymph: [name] lymphadenopathy of posterior or anterior cervical chain or axillae bilaterally.  Gait normal with good balance and coordination.  MSK:  Non tender with full range of motion and good stability and symmetric  strength and tone of shoulders, elbows, wrist, hip, knee and ankles bilaterally.  Neck: Inspection unremarkable. [name] palpable stepoffs. Negative Spurling's maneuver. Full neck range of motion Grip strength and sensation normal in bilateral hands Strength good C4 to T1 distribution [name] sensory change to C4 to T1 Negative Hoffman sign bilaterally Reflexes normal Mild pain over the parascapular area on the medial aspect.  Osteopathic findings C2 flexed rotated and side bent right C4 flexed rotated and side bent left T3 extended rotated and side bent right inhaled third rib T6 extended rotated and side bent left L3 flexed rotated and side bent right Sacrum right on right    Impression and Recommendations:     This case required medical decision making of moderate complexity. The above documentation has been reviewed and is accurate and complete Kelsey Pulley, DO       Note: This dictation was prepared with Dragon dictation along with smaller phrase technology. Any transcriptional errors that result from this process are unintentional.

## 2018-09-01 ENCOUNTER — Encounter: Payer: Self-pay | Admitting: Family Medicine

## 2018-09-01 ENCOUNTER — Ambulatory Visit: Payer: BLUE CROSS/BLUE SHIELD | Admitting: Family Medicine

## 2018-09-01 VITALS — BP 108/58 | HR 52 | Ht 64.0 in | Wt 142.0 lb

## 2018-09-01 DIAGNOSIS — G2589 Other specified extrapyramidal and movement disorders: Secondary | ICD-10-CM | POA: Diagnosis not present

## 2018-09-01 DIAGNOSIS — M999 Biomechanical lesion, unspecified: Secondary | ICD-10-CM

## 2018-09-01 NOTE — Assessment & Plan Note (Addendum)
Decision today to treat with OMT was based on Physical Exam  After verbal consent patient was treated with HVLA, ME, FPR techniques in cervical, thoracic, rib, lumbar and sacral areas  Patient tolerated the procedure well with improvement in symptoms  Patient given exercises, stretches and lifestyle modifications  See medications in patient instructions if given  Patient will follow up in 4-12 weeks

## 2018-09-01 NOTE — Assessment & Plan Note (Signed)
Scapular dyskinesis.  Discussed home exercise icing regimen, which activities to doing which wants to avoid.  Follow-up in 4 to 8 weeks

## 2018-09-01 NOTE — Patient Instructions (Signed)
Good to see you  Kelsey [name] is your friend.  Call [phone] when you need me!

## 2018-09-09 DIAGNOSIS — L049 Acute lymphadenitis, unspecified: Secondary | ICD-10-CM | POA: Diagnosis not present

## 2018-11-05 DIAGNOSIS — R05 Cough: Secondary | ICD-10-CM | POA: Diagnosis not present

## 2018-11-05 DIAGNOSIS — H6981 Other specified disorders of Eustachian tube, right ear: Secondary | ICD-10-CM | POA: Diagnosis not present

## 2018-11-05 DIAGNOSIS — R0981 Nasal congestion: Secondary | ICD-10-CM | POA: Diagnosis not present

## 2018-11-23 NOTE — Progress Notes (Signed)
[name] [address] [place], [place] [address] Phone: [phone] Subjective:    I Kandace Blitz am serving as a Education administrator for Dr. Hulan Saas.   CC: Neck and shoulder pain  [id number]  Kelsey [name] is a 33 y.o. female coming in with complaint of shoulder pain. States that she is tight today. Wants her ear checked. Right ear seems clogged. States that it needs to pop. Has been going for 2 months. \Has been seen previously as well for scapular dyskinesis.  States more tightness than usual.  Has been quite sometime since we have seen patient.  Has been trying to be active but finding it difficult.     Past Medical History:  Diagnosis Date  . Abnormal pap    ASC-US   . History of HPV infection   . Vulvar lesion    Past Surgical History:  Procedure Laterality Date  . KNEE SURGERY     Social History   Socioeconomic History  . Marital status: Single    Spouse name: Not on file  . Number of children: Not on file  . Years of education: Not on file  . Highest education level: Not on file  Occupational History  . Not on file  Social Needs  . Financial resource strain: Not on file  . Food insecurity:    Worry: Not on file    Inability: Not on file  . Transportation needs:    Medical: Not on file    Non-medical: Not on file  Tobacco Use  . Smoking status: Never Smoker  . Smokeless tobacco: Never Used  Substance and Sexual Activity  . Alcohol use: Yes    Comment: socially not while pregnant  . Drug use: No  . Sexual activity: Yes    Partners: Male    Birth control/protection: None  Lifestyle  . Physical activity:    Days per week: Not on file    Minutes per session: Not on file  . Stress: Not on file  Relationships  . Social connections:    Talks on phone: Not on file    Gets together: Not on file    Attends religious service: Not on file    Active member of club or organization: Not on file    Attends meetings of clubs or  organizations: Not on file    Relationship status: Not on file  Other Topics Concern  . Not on file  Social History Narrative  . Not on file   No Known Allergies Family History  Problem Relation Age of Onset  . Hyperlipidemia Father   . Hypertension Father   . Cancer Maternal Grandfather   . Arthritis Paternal Grandmother         Current Outpatient Medications (Hematological):  .  vitamin B-12 (CYANOCOBALAMIN) 100 MCG tablet, Take 100 mcg by mouth daily.  Current Outpatient Medications (Other):  Marland Kitchen  Vitamin D, Ergocalciferol, (DRISDOL) 50000 units CAPS capsule, Take 1 capsule (50,000 Units total) by mouth every 7 (seven) days.    Past medical history, social, surgical and family history all reviewed in electronic medical record.  No pertanent information unless stated regarding to the chief complaint.   Review of Systems:  No headache, visual changes, nausea, vomiting, diarrhea, constipation, dizziness, abdominal pain, skin rash, fevers, chills, night sweats, weight loss, swollen lymph nodes, body aches, joint swelling, muscle aches, chest pain, shortness of breath, mood changes.   Objective  Blood pressure 110/74, pulse 70, height 5'  4" (1.626 m), weight 138 lb (62.6 kg), SpO2 99 %, unknown if currently breastfeeding.    General: No apparent distress alert and oriented x3 mood and affect normal, dressed appropriately.  HEENT: Pupils equal, extraocular movements intact  Respiratory: Patient's speak in full sentences and does not appear short of breath  Cardiovascular: No lower extremity edema, non tender, no erythema  Skin: Warm dry intact with no signs of infection or rash on extremities or on axial skeleton.  Abdomen: Soft nontender  Neuro: Cranial nerves II through XII are intact, neurovascularly intact in all extremities with 2+ DTRs and 2+ pulses.  Lymph: No lymphadenopathy of posterior or anterior cervical chain or axillae bilaterally.  Gait normal with good balance  and coordination.  MSK:  Non tender with full range of motion and good stability and symmetric strength and tone of shoulders, elbows, wrist, hip, knee and ankles bilaterally.  Neck: Inspection mild loss of lordosis. No palpable stepoffs. Negative Spurling's maneuver. Mild limited range of motion lacking last 5 degrees of sidebending Grip strength and sensation normal in bilateral hands Strength good C4 to T1 distribution No sensory change to C4 to T1 Negative Hoffman sign bilaterally Reflexes normal Tightness of the trapezius right side more than left.  Multiple trigger points noted  Osteopathic findings C6 flexed rotated and side bent left T3 extended rotated and side bent right inhaled third rib T7 extended rotated and side bent left L4 flexed rotated and side bent left Sacrum right on right    Impression and Recommendations:     This case required medical decision making of moderate complexity. The above documentation has been reviewed and is accurate and complete Lyndal Pulley, DO       Note: This dictation was prepared with Dragon dictation along with smaller phrase technology. Any transcriptional errors that result from this process are unintentional.

## 2018-11-24 ENCOUNTER — Ambulatory Visit: Payer: BLUE CROSS/BLUE SHIELD | Admitting: Family Medicine

## 2018-11-24 ENCOUNTER — Encounter: Payer: Self-pay | Admitting: Family Medicine

## 2018-11-24 VITALS — BP 110/74 | HR 70 | Ht 64.0 in | Wt 138.0 lb

## 2018-11-24 DIAGNOSIS — M999 Biomechanical lesion, unspecified: Secondary | ICD-10-CM | POA: Insufficient documentation

## 2018-11-24 DIAGNOSIS — G2589 Other specified extrapyramidal and movement disorders: Secondary | ICD-10-CM

## 2018-11-24 NOTE — Assessment & Plan Note (Signed)
Decision today to treat with OMT was based on Physical Exam  After verbal consent patient was treated with HVLA, ME, FPR techniques in cervical, thoracic, rib lumbar and sacral areas  Patient tolerated the procedure well with improvement in symptoms  Patient given exercises, stretches and lifestyle modifications  See medications in patient instructions if given  Patient will follow up in 4-8 weeks 

## 2018-11-24 NOTE — Assessment & Plan Note (Signed)
Continues to have some trouble.  Discussed icing regimen and home exercise.  Discussed which activities to do which wants to avoid.  Increase activity slowly over the course the next several weeks.  Patient has not been working out as much.  Patient is to increase activity slowly.  Follow-up again 4 to 8 weeks

## 2018-11-24 NOTE — Patient Instructions (Signed)
Good to see you  Flonase, singulair or hydroxyzine  See me again in 3 months or write me if you need me sooner

## 2018-11-25 DIAGNOSIS — H938X1 Other specified disorders of right ear: Secondary | ICD-10-CM | POA: Diagnosis not present

## 2018-11-25 DIAGNOSIS — R0981 Nasal congestion: Secondary | ICD-10-CM | POA: Diagnosis not present

## 2018-11-25 DIAGNOSIS — H6981 Other specified disorders of Eustachian tube, right ear: Secondary | ICD-10-CM | POA: Diagnosis not present

## 2018-11-29 ENCOUNTER — Encounter: Payer: Self-pay | Admitting: Family Medicine

## 2018-11-30 DIAGNOSIS — J301 Allergic rhinitis due to pollen: Secondary | ICD-10-CM | POA: Diagnosis not present

## 2019-01-06 DIAGNOSIS — H6981 Other specified disorders of Eustachian tube, right ear: Secondary | ICD-10-CM | POA: Diagnosis not present

## 2019-06-21 DIAGNOSIS — E60 Dietary zinc deficiency: Secondary | ICD-10-CM | POA: Diagnosis not present

## 2019-06-21 DIAGNOSIS — E039 Hypothyroidism, unspecified: Secondary | ICD-10-CM | POA: Diagnosis not present

## 2019-06-21 DIAGNOSIS — E559 Vitamin D deficiency, unspecified: Secondary | ICD-10-CM | POA: Diagnosis not present

## 2019-06-21 DIAGNOSIS — E538 Deficiency of other specified B group vitamins: Secondary | ICD-10-CM | POA: Diagnosis not present

## 2019-06-21 DIAGNOSIS — R5383 Other fatigue: Secondary | ICD-10-CM | POA: Diagnosis not present

## 2020-04-25 IMAGING — DX DG ANKLE COMPLETE 3+V*L*
3 series · 3 of 3 positions shown · non-contrast
Comparison: None.

CLINICAL DATA: Ankle sprain 2 months ago with persistent pain

EXAM:
LEFT ANKLE COMPLETE - 3+ VIEW

[ankle ap]
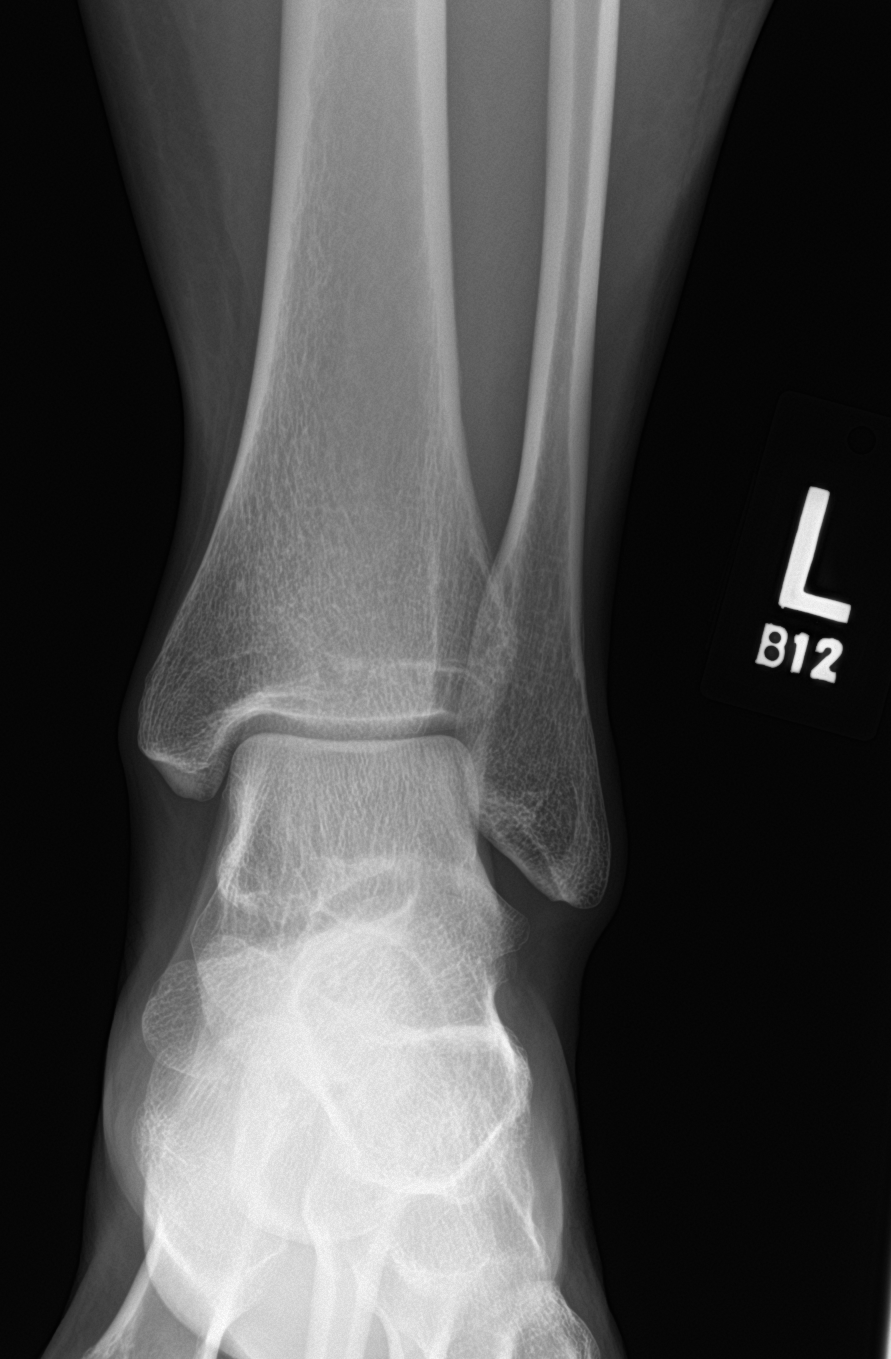

[ankle obl]
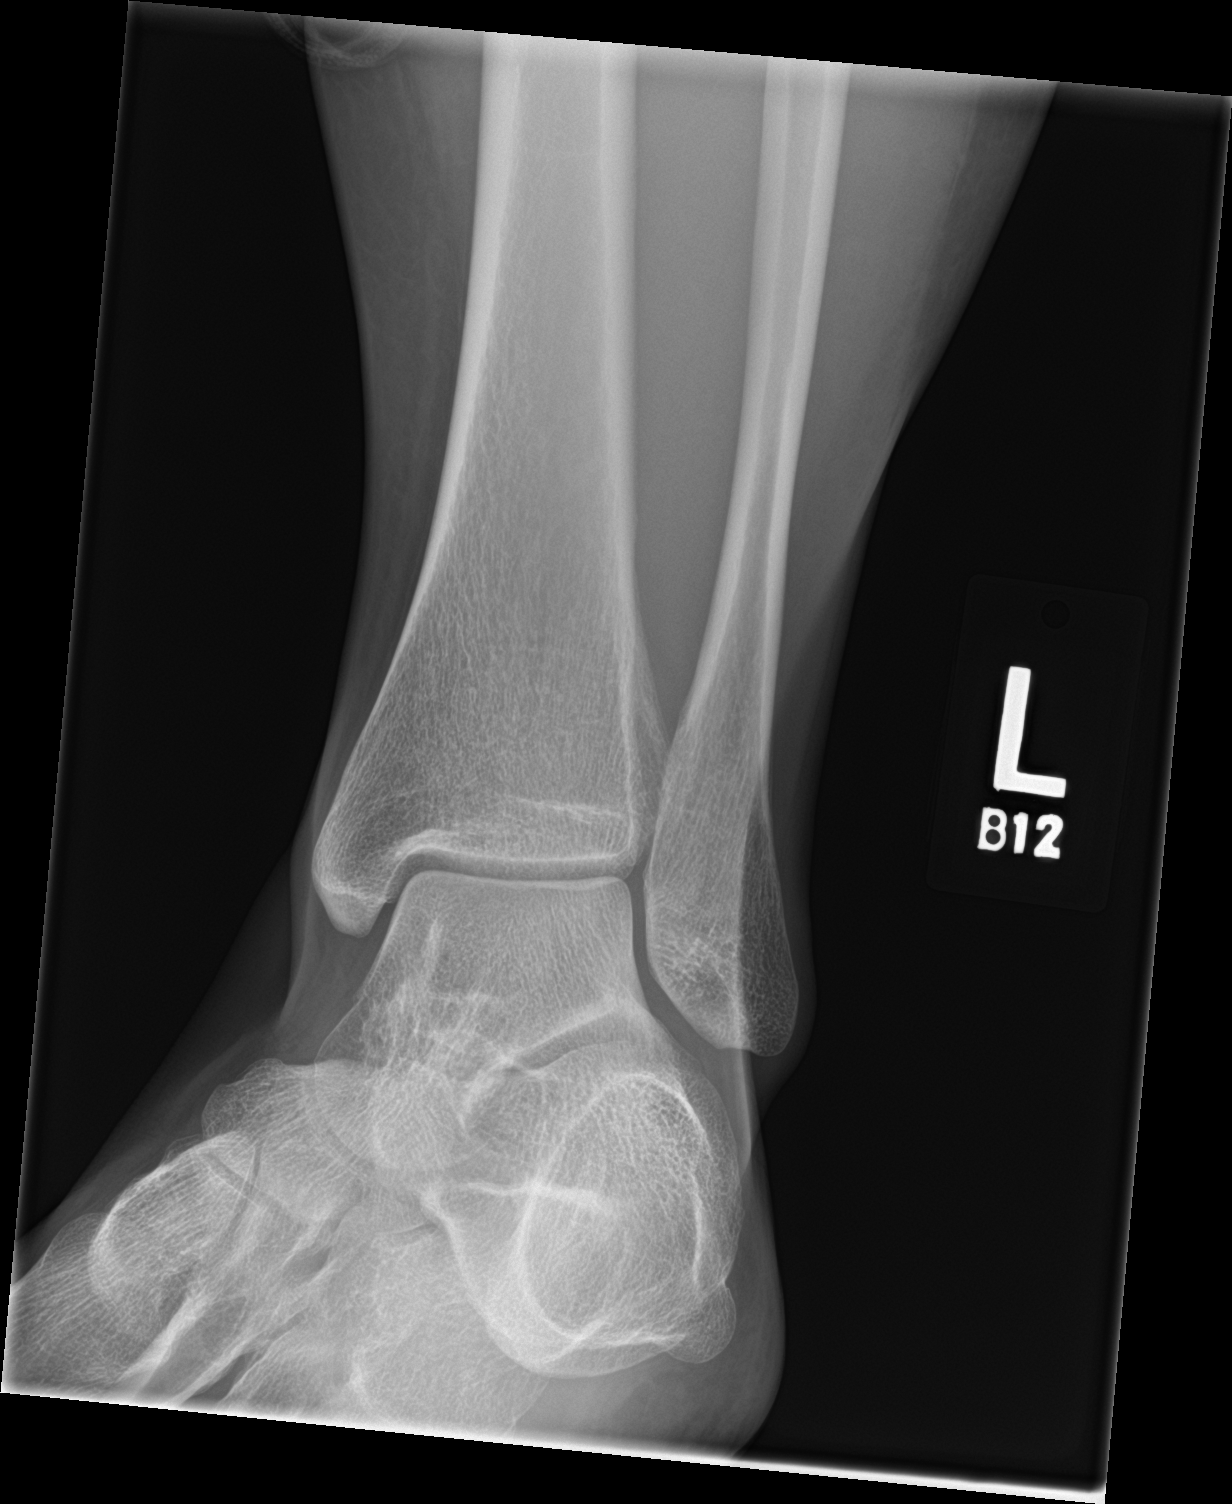

[ankle lat]
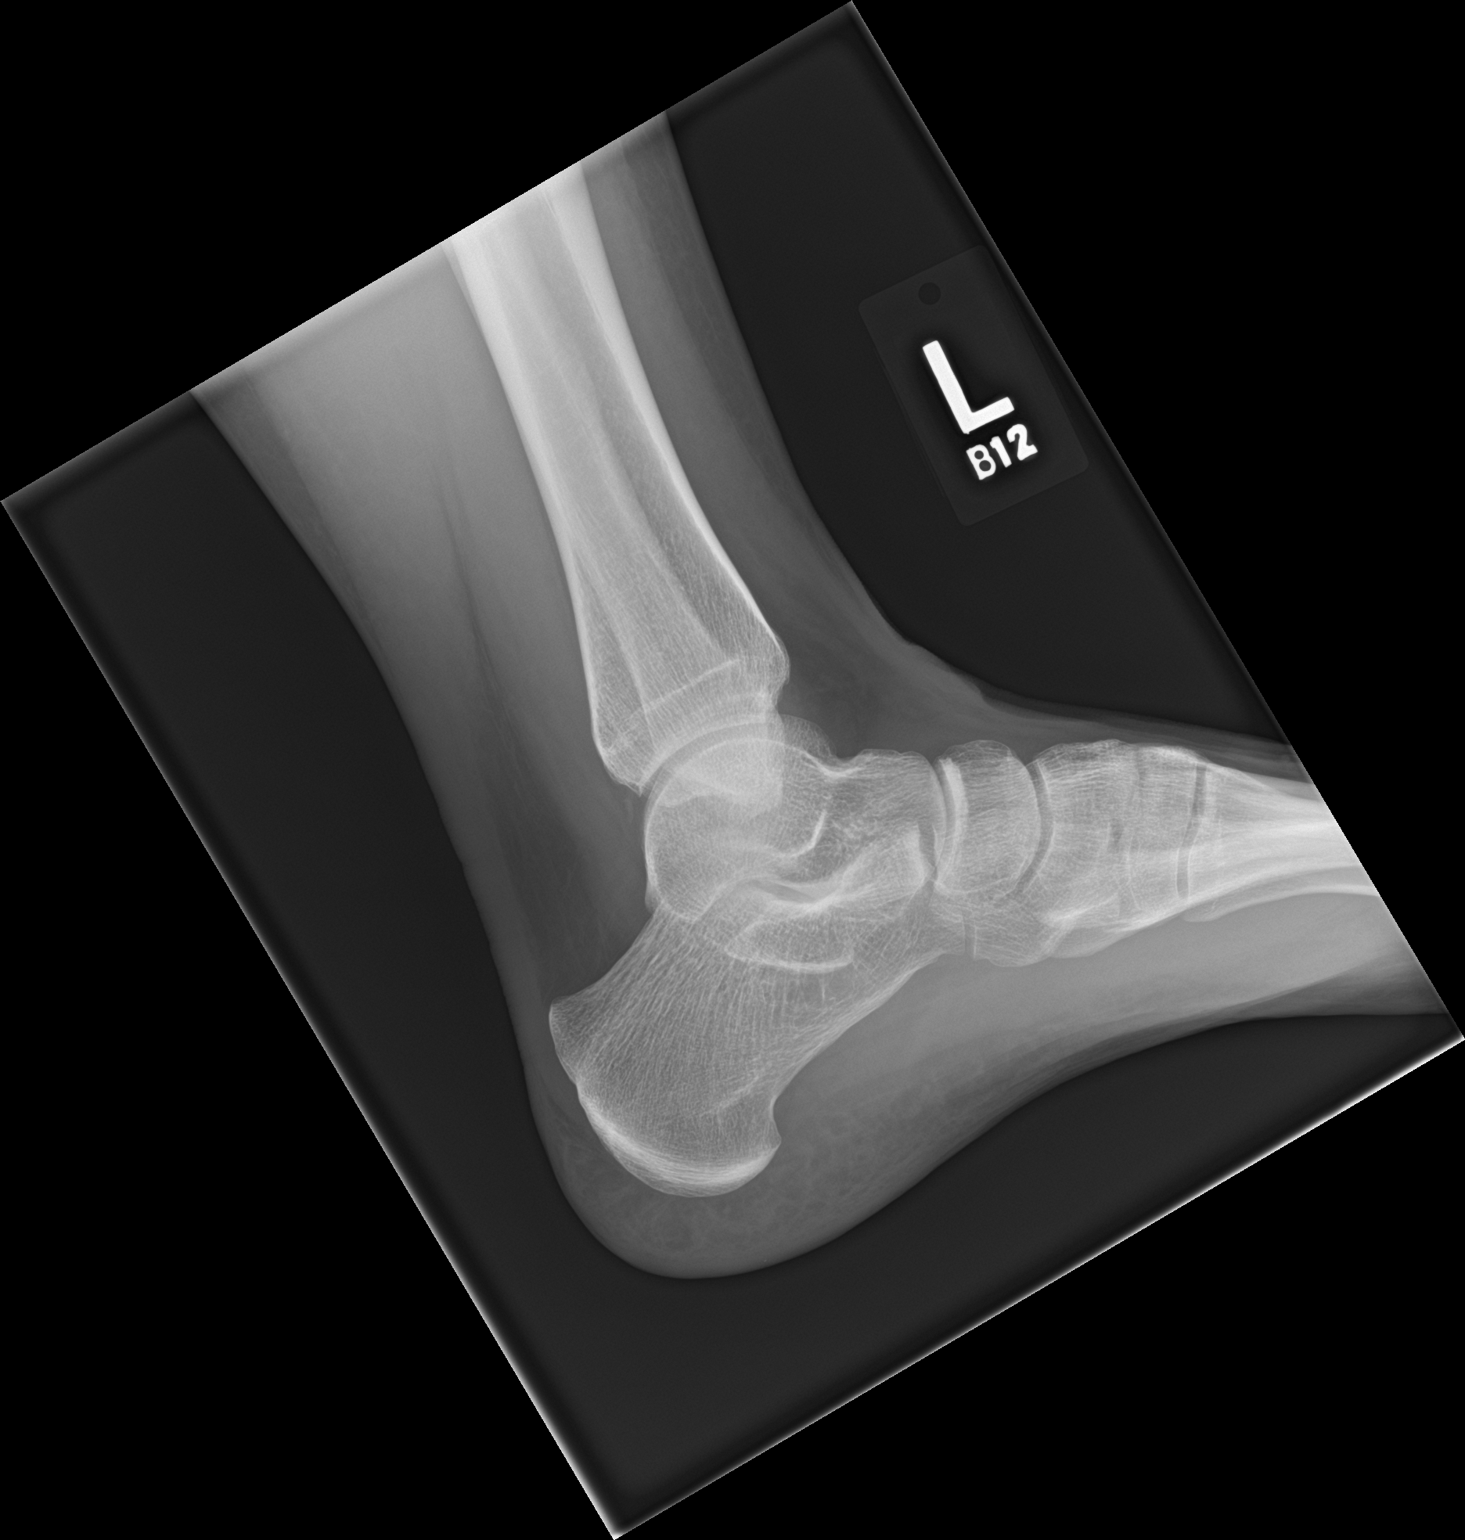

[3 of 3 positions shown; findings below may reference images not displayed]

FINDINGS: There is no evidence of fracture, dislocation, or joint effusion.
There is no evidence of arthropathy or other focal bone abnormality.
Soft tissues are unremarkable.
IMPRESSION: No acute abnormality noted.
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
## 2021-06-20 ENCOUNTER — Encounter: Payer: Self-pay | Admitting: Family Medicine

## 2021-06-20 ENCOUNTER — Ambulatory Visit (INDEPENDENT_AMBULATORY_CARE_PROVIDER_SITE_OTHER): Payer: BC Managed Care – PPO | Admitting: Family Medicine

## 2021-06-20 ENCOUNTER — Other Ambulatory Visit: Payer: Self-pay

## 2021-06-20 ENCOUNTER — Ambulatory Visit (INDEPENDENT_AMBULATORY_CARE_PROVIDER_SITE_OTHER): Payer: BC Managed Care – PPO

## 2021-06-20 VITALS — BP 110/80 | HR 61 | Ht 64.0 in | Wt 147.8 lb

## 2021-06-20 DIAGNOSIS — M25572 Pain in left ankle and joints of left foot: Secondary | ICD-10-CM | POA: Diagnosis not present

## 2021-06-20 NOTE — Patient Instructions (Addendum)
Good to see you  MRI of left ankle [phone] to call and schedule  Will be in touch when the results of the MRI are in
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
## 2022-04-23 ENCOUNTER — Telehealth: Payer: Self-pay | Admitting: Family Medicine
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
## 2022-04-23 NOTE — Telephone Encounter (Signed)
Spoke with patient. Placed on cancellation list and on schedule with Dr. Georgina Snell next Friday.

## 2022-05-02 ENCOUNTER — Ambulatory Visit: Payer: BC Managed Care – PPO | Admitting: Family Medicine

## 2022-05-29 NOTE — Progress Notes (Signed)
[place] Phone: [phone] Subjective:   [name], am serving as a scribe for Dr. Hulan Saas.  I'm seeing this patient by the request  of:  Patient, No Pcp Per  CC: Right foot pain  [id number]  Kelsey [name] is a 36 y.o. female coming in with complaint of R foot pain. Last seen in August 2022 for L ankle pain. Patient states that she had injury to R pinky toe. Caught toe on corner of wall. One week after injury she felt a pop and then her pain started to improve. Still not 100%. Pain over metatarsal head. No pain but notices that something feels different.   Would also like adjustment with her lower back.     Past Medical History:  Diagnosis Date   Abnormal pap    ASC-US    History of HPV infection    Vulvar lesion    Past Surgical History:  Procedure Laterality Date   KNEE SURGERY     Social History   Socioeconomic History   Marital status: Single    Spouse name: Not on file   Number of children: Not on file   Years of education: Not on file   Highest education level: Not on file  Occupational History   Not on file  Tobacco Use   Smoking status: Never   Smokeless tobacco: Never  Substance and Sexual Activity   Alcohol use: Yes    Comment: socially not while pregnant   Drug use: No   Sexual activity: Yes    Partners: Male    Birth control/protection: None  Other Topics Concern   Not on file  Social History Narrative   Not on file   Social Determinants of Health   Financial Resource Strain: Not on file  Food Insecurity: Not on file  Transportation Needs: Not on file  Physical Activity: Not on file  Stress: Not on file  Social Connections: Not on file   No Known Allergies Family History  Problem Relation Age of Onset   Hyperlipidemia Father    Hypertension Father    Cancer Maternal Grandfather    Arthritis Paternal Grandmother         Current Outpatient  Medications (Hematological):    vitamin B-12 (CYANOCOBALAMIN) 100 MCG tablet, Take 100 mcg by mouth daily.  Current Outpatient Medications (Other):    Vitamin D, Ergocalciferol, (DRISDOL) 50000 units CAPS capsule, Take 1 capsule (50,000 Units total) by mouth every 7 (seven) days.   Reviewed prior external information including notes and imaging from  primary care provider As well as notes that were available from care everywhere and other healthcare systems.  Past medical history, social, surgical and family history all reviewed in electronic medical record.  No pertanent information unless stated regarding to the chief complaint.   Review of Systems:  No , visual changes, nausea, vomiting, diarrhea, constipation, dizziness, abdominal pain, skin rash, fevers, chills, night sweats, weight loss, swollen lymph nodes, body aches, joint swelling, chest pain, shortness of breath, mood changes. POSITIVE muscle aches with some mild headaches and sinuses  Objective  Blood pressure 122/78, pulse 65, height '5\' 4"'$  (1.626 m), weight 138 lb (62.6 kg), SpO2 98 %, unknown if currently breastfeeding.   General: No apparent distress alert and oriented x3 mood and affect normal, dressed appropriately.  HEENT: Pupils equal, extraocular movements intact  Respiratory: Patient's speak in full sentences and does not appear short of breath  Cardiovascular: No lower extremity edema, non tender, no erythema  Right foot exam shows the patient does have some mild tenderness over the lateral aspect of the fifth proximal phalanx.  Neurovascularly intact, good capillary refill.  No pain at the proximal MCP.  Neck exam does have some tightness noted.  Tenderness to palpation on the right side greater than left.  Patient does have a negative straight leg test no noted.  Tightness of the FABER test though right greater than left.    Limited muscular skeletal ultrasound was performed and interpreted by Hulan Saas, M   Limited ultrasound of patient's foot does not show anything other than mild hypoechoic changes noted of the PIP joint.  No cortical irregularity noted at the moment.  No increase in Doppler flow.  No sign of any neuroma. Impression: Mild synovitis of the PIP of the fifth toe on the right foot  Osteopathic findings C2 flexed rotated and side bent right C4 flexed rotated and side bent left C6 flexed rotated and side bent left T3 extended rotated and side bent right inhaled third rib T9 extended rotated and side bent left L2 flexed rotated and side bent right Sacrum right on right     Impression and Recommendations:     The above documentation has been reviewed and is accurate and complete Lyndal Pulley, DO

## 2022-06-02 ENCOUNTER — Ambulatory Visit: Payer: Self-pay

## 2022-06-02 ENCOUNTER — Ambulatory Visit: Payer: BC Managed Care – PPO | Admitting: Family Medicine

## 2022-06-02 ENCOUNTER — Encounter: Payer: Self-pay | Admitting: Family Medicine

## 2022-06-02 VITALS — BP 122/78 | HR 65 | Ht 64.0 in | Wt 138.0 lb

## 2022-06-02 DIAGNOSIS — M9901 Segmental and somatic dysfunction of cervical region: Secondary | ICD-10-CM | POA: Diagnosis not present

## 2022-06-02 DIAGNOSIS — M79671 Pain in right foot: Secondary | ICD-10-CM

## 2022-06-02 DIAGNOSIS — G2589 Other specified extrapyramidal and movement disorders: Secondary | ICD-10-CM

## 2022-06-02 DIAGNOSIS — M9903 Segmental and somatic dysfunction of lumbar region: Secondary | ICD-10-CM

## 2022-06-02 DIAGNOSIS — M9902 Segmental and somatic dysfunction of thoracic region: Secondary | ICD-10-CM

## 2022-06-02 DIAGNOSIS — M9904 Segmental and somatic dysfunction of sacral region: Secondary | ICD-10-CM

## 2022-06-02 DIAGNOSIS — M9908 Segmental and somatic dysfunction of rib cage: Secondary | ICD-10-CM

## 2022-06-02 NOTE — Assessment & Plan Note (Signed)
Still has some scapular dyskinesis.  Discussed with patient about icing regimen, core strengthening.  Responds well to manipulation though.

## 2022-06-02 NOTE — Patient Instructions (Addendum)
Xray at [facility] today if you want it  Rigid soled shoe Find ways to not be as stressed Try oral splint for sinus Arnica lotion and ice to toe New Balance Minimus  If needed see me in 6-8 weeks

## 2022-06-02 NOTE — Assessment & Plan Note (Signed)
New problem.  Discussed rigid soled shoe, seems to be more of a cellulitis.  Patient declined injection which I think patient will do well with time.  We discussed with patient that certain shoes might actually be giving more difficulty.  Follow-up with me again in 6 to 8 weeks.

## 2022-07-16 NOTE — Progress Notes (Signed)
[place] Phone: [phone] Subjective:   Kelsey Kelsey [name], am serving as a scribe for Dr. Hulan Saas.  I'm seeing this patient by the request  of:  Patient, Kelsey [name] Pcp Per  CC: Foot pain and back pain follow-up  JOA:[id number]  Kelsey Kelsey [name] is a 36 y.o. female coming in with complaint of back and neck pain. OMT on 06/02/2022. Also seen for R foot pain. Patient states that her foot pain has improved. Does still get irritated at the gym with explosive movements. Foot will be swollen after doing these activities.   Tightness in neck and back today.   Medications patient has been prescribed: None  Taking:         Reviewed prior external information including notes and imaging from previsou exam, outside providers and external EMR if available.   As well as notes that were available from care everywhere and other healthcare systems.  Past medical history, social, surgical and family history all reviewed in electronic medical record.  Kelsey [name] pertanent information unless stated regarding to the chief complaint.   Past Medical History:  Diagnosis Date   Abnormal pap    ASC-US    History of HPV infection    Vulvar lesion     Kelsey [name] Known Allergies   Review of Systems:  Kelsey [name] headache, visual changes, nausea, vomiting, diarrhea, constipation, dizziness, abdominal pain, skin rash, fevers, chills, night sweats, weight loss, swollen lymph nodes, body aches, joint swelling, chest pain, shortness of breath, mood changes. POSITIVE muscle aches  Objective  Blood pressure 110/82, pulse 78, height '5\' 4"'$  (1.626 m), weight 148 lb (67.1 kg), SpO2 99 %, unknown if currently breastfeeding.   General: Kelsey [name] apparent distress alert and oriented x3 mood and affect normal, dressed appropriately.  HEENT: Pupils equal, extraocular movements intact  Respiratory: Patient's speak in full sentences and does not appear short of breath   Cardiovascular: Kelsey [name] lower extremity edema, non tender, Kelsey [name] erythema  Gait normal overall.  Right foot exam shows some very mild swelling of the fifth toe but nothing severe.  Kelsey [name] sign of any cellulitis at the moment.  Nontender on exam. MSK:  Back does have significant tightness noted more on the right side of the paraspinal musculature.  Patient does have bone pain though seems to be on the left sacroiliac joint.  Back exam also shows a lipoma over the left paraspinal musculature.  Limited muscular skeletal ultrasound was performed and interpreted by Hulan Saas, M    Osteopathic findings  C2 flexed rotated and side bent right C3 flexed rotated and side bent left T3 extended rotated and side bent right inhaled rib T9 extended rotated and side bent left L1 flexed rotated and side bent right Sacrum left on left       Assessment and Plan:  Right foot pain Patient is doing very well.  Still has some mild synovitis noted.  Kelsey [name] significant cortical irregularity but does have a little callus formation noted as well.  Lipoma Patient has a lipoma over the left sacroiliac joint.  Patient feels like there is multiple at home.  We discussed the possibility of referral if patient would like removal which patient declined.  Freely movable nontender do not feel that there is anything else significantly severe at this time that would need further work-up patient would like to continue to monitor.    Nonallopathic problems  Decision today to treat with OMT was based on Physical  Exam  After verbal consent patient was treated with HVLA, ME, FPR techniques in cervical, rib, thoracic, lumbar, and sacral  areas  Patient tolerated the procedure well with improvement in symptoms  Patient given exercises, stretches and lifestyle modifications  See medications in patient instructions if given  Patient will follow up in 4-8 weeks     The above documentation has been reviewed and is accurate and  complete Lyndal Pulley, DO         Note: This dictation was prepared with Dragon dictation along with smaller phrase technology. Any transcriptional errors that result from this process are unintentional.

## 2022-07-17 ENCOUNTER — Ambulatory Visit: Payer: Self-pay

## 2022-07-17 ENCOUNTER — Ambulatory Visit: Payer: BC Managed Care – PPO | Admitting: Family Medicine

## 2022-07-17 ENCOUNTER — Encounter: Payer: Self-pay | Admitting: Family Medicine

## 2022-07-17 VITALS — BP 110/82 | HR 78 | Ht 64.0 in | Wt 148.0 lb

## 2022-07-17 DIAGNOSIS — M9904 Segmental and somatic dysfunction of sacral region: Secondary | ICD-10-CM | POA: Diagnosis not present

## 2022-07-17 DIAGNOSIS — M79671 Pain in right foot: Secondary | ICD-10-CM

## 2022-07-17 DIAGNOSIS — M9902 Segmental and somatic dysfunction of thoracic region: Secondary | ICD-10-CM

## 2022-07-17 DIAGNOSIS — M9901 Segmental and somatic dysfunction of cervical region: Secondary | ICD-10-CM

## 2022-07-17 DIAGNOSIS — D171 Benign lipomatous neoplasm of skin and subcutaneous tissue of trunk: Secondary | ICD-10-CM | POA: Diagnosis not present

## 2022-07-17 DIAGNOSIS — M9908 Segmental and somatic dysfunction of rib cage: Secondary | ICD-10-CM

## 2022-07-17 DIAGNOSIS — D179 Benign lipomatous neoplasm, unspecified: Secondary | ICD-10-CM | POA: Insufficient documentation

## 2022-07-17 DIAGNOSIS — M9903 Segmental and somatic dysfunction of lumbar region: Secondary | ICD-10-CM

## 2022-07-17 DIAGNOSIS — J329 Chronic sinusitis, unspecified: Secondary | ICD-10-CM

## 2022-07-17 NOTE — Assessment & Plan Note (Signed)
Patient has a lipoma over the left sacroiliac joint.  Patient feels like there is multiple at home.  We discussed the possibility of referral if patient would like removal which patient declined.  Freely movable nontender do not feel that there is anything else significantly severe at this time that would need further work-up patient would like to continue to monitor.

## 2022-07-17 NOTE — Assessment & Plan Note (Signed)
Patient is doing very well.  Still has some mild synovitis noted.  No significant cortical irregularity but does have a little callus formation noted as well.

## 2022-07-17 NOTE — Patient Instructions (Addendum)
Good to see you ENT referral  Ankle exercises Lipoma  See me in 6-8 weeks

## 2022-07-25 ENCOUNTER — Ambulatory Visit: Payer: BC Managed Care – PPO | Admitting: Family Medicine

## 2022-08-21 NOTE — Progress Notes (Signed)
Kelsey [name] [place] [address] [place] Phone: [phone] Subjective:   Kelsey [name], am serving as a scribe for Dr. Hulan Saas.   I'm seeing this patient by the request  of:  Patient, No Pcp Per  CC: Back pain, ankle pain  GLO:[id number]  07/17/2022 Patient is doing very well.  Still has some mild synovitis noted.  No significant cortical irregularity but does have a little callus formation noted as well  Update 08/26/2022 Kelsey [name] is a 36 y.o. female coming in with complaint of back and neck pain. OMT on 07/17/2022. Also seen for foot pain. Patient states left ankle and right toe doing a little bit better. She got new shoes. Wants to inquire about her left heel tightness and right knee pain. Feels the knee pain mostly when going from flexion to extension. Feels a pop that is accompanied by pain. Has happened before.  Medications patient has been prescribed: None  Taking:         Reviewed prior external information including notes and imaging from previsou exam, outside providers and external EMR if available.   As well as notes that were available from care everywhere and other healthcare systems.  Past medical history, social, surgical and family history all reviewed in electronic medical record.  No pertanent information unless stated regarding to the chief complaint.   Past Medical History:  Diagnosis Date   Abnormal pap    ASC-US    History of HPV infection    Vulvar lesion     No Known Allergies   Review of Systems:  No headache, visual changes, nausea, vomiting, diarrhea, constipation, dizziness, abdominal pain, skin rash, fevers, chills, night sweats, weight loss, swollen lymph nodes, body aches, joint swelling, chest pain, shortness of breath, mood changes. POSITIVE muscle aches  Objective  Blood pressure 114/72, pulse 63, height '5\' 4"'$  (1.626 m), weight 151 lb (68.5 kg), SpO2 98 %, unknown if currently  breastfeeding.   General: No apparent distress alert and oriented x3 mood and affect normal, dressed appropriately.  HEENT: Pupils equal, extraocular movements intact  Respiratory: Patient's speak in full sentences and does not appear short of breath  Cardiovascular: No lower extremity edema, non tender, no erythema  Left heel does have a little tightness of the posterior cord noted.  Good range of motion though noted.  Patient does mild discomfort but good strength.  Patient's low back though does have significant loss of lordosis.  Tightness with FABER test bilaterally.  Osteopathic findings  C2 flexed rotated and side bent right C5 flexed rotated and side bent left T3 extended rotated and side bent right inhaled rib T8 extended rotated and side bent left L2 flexed rotated and side bent right Sacrum right on right  Limited muscular skeletal ultrasound was performed and interpreted by Hulan Saas, M  Limited ultrasound does show that patient does have a very small chronic hypoechoic changes in the retrocalcaneal area.  Not putting any stress on the area at the moment such as the Achilles.  No increasing Doppler flow. Impression: Chronic retrocalcaneal bursitis     Assessment and Plan:  Left ankle pain I believe patient does have some signs and symptoms that would be more consistent with a bursitis.  Patient noticed it more when she was not as active and walking around more barefoot.  We discussed how I feel it could be beneficial, other ideas would be potentially injection.  I am hoping that patient does  make improvement at this time.  Discussed icing regimen and home exercises.  Follow-up again in 6 to 8 weeks.  Scapular dyskinesis Continue scapular dyskinesis.  Discussed posture and ergonomics, discussed which activities to do and which ones to avoid.  Discussed different daily activities and ergonomics that could be helpful.  Follow-up again in 6 to 8 weeks    Nonallopathic  problems  Decision today to treat with OMT was based on Physical Exam  After verbal consent patient was treated with HVLA, ME, FPR techniques in cervical, rib, thoracic, lumbar, and sacral  areas  Patient tolerated the procedure well with improvement in symptoms  Patient given exercises, stretches and lifestyle modifications  See medications in patient instructions if given  Patient will follow up in 4-8 weeks     The above documentation has been reviewed and is accurate and complete Kelsey Pulley, DO         Note: This dictation was prepared with Dragon dictation along with smaller phrase technology. Any transcriptional errors that result from this process are unintentional.

## 2022-08-25 ENCOUNTER — Ambulatory Visit: Payer: BC Managed Care – PPO | Admitting: Family Medicine

## 2022-08-25 ENCOUNTER — Ambulatory Visit: Payer: Self-pay

## 2022-08-25 VITALS — BP 114/72 | HR 63 | Ht 64.0 in | Wt 151.0 lb

## 2022-08-25 DIAGNOSIS — M9908 Segmental and somatic dysfunction of rib cage: Secondary | ICD-10-CM | POA: Diagnosis not present

## 2022-08-25 DIAGNOSIS — M9901 Segmental and somatic dysfunction of cervical region: Secondary | ICD-10-CM

## 2022-08-25 DIAGNOSIS — M25572 Pain in left ankle and joints of left foot: Secondary | ICD-10-CM

## 2022-08-25 DIAGNOSIS — M9902 Segmental and somatic dysfunction of thoracic region: Secondary | ICD-10-CM

## 2022-08-25 DIAGNOSIS — G2589 Other specified extrapyramidal and movement disorders: Secondary | ICD-10-CM | POA: Diagnosis not present

## 2022-08-25 DIAGNOSIS — M9903 Segmental and somatic dysfunction of lumbar region: Secondary | ICD-10-CM | POA: Diagnosis not present

## 2022-08-25 DIAGNOSIS — M9904 Segmental and somatic dysfunction of sacral region: Secondary | ICD-10-CM

## 2022-08-25 DIAGNOSIS — M79671 Pain in right foot: Secondary | ICD-10-CM

## 2022-08-25 NOTE — Patient Instructions (Signed)
Good to see you! Do prescribed exercises at least 3x a week Hoka or Oofos recovery sandals in the house

## 2022-08-26 NOTE — Assessment & Plan Note (Signed)
I believe patient does have some signs and symptoms that would be more consistent with a bursitis.  Patient noticed it more when she was not as active and walking around more barefoot.  We discussed how I feel it could be beneficial, other ideas would be potentially injection.  I am hoping that patient does make improvement at this time.  Discussed icing regimen and home exercises.  Follow-up again in 6 to 8 weeks.

## 2022-08-26 NOTE — Assessment & Plan Note (Signed)
Continue scapular dyskinesis.  Discussed posture and ergonomics, discussed which activities to do and which ones to avoid.  Discussed different daily activities and ergonomics that could be helpful.  Follow-up again in 6 to 8 weeks

## 2022-10-03 NOTE — Progress Notes (Unsigned)
  [name] [place] Phone: [phone] Subjective:    I'm seeing this patient by the request  of:  Patient, No Pcp Per  CC: Neck and back pain follow-up  PNT:[id number]  08/25/2022 I believe patient does have some signs and symptoms that would be more consistent with a bursitis. Patient noticed it more when she was not as active and walking around more barefoot. We discussed how I feel it could be beneficial, other ideas would be potentially injection. I am hoping that patient does make improvement at this time. Discussed icing regimen and home exercises. Follow-up again in 6 to 8 weeks.    10/06/2022 Kelsey [name] is a 36 y.o. female coming in with complaint of back and neck pain. OMT 08/25/2022. Patient states   Medications patient has been prescribed: None  Taking:         Reviewed prior external information including notes and imaging from previsou exam, outside providers and external EMR if available.   As well as notes that were available from care everywhere and other healthcare systems.  Past medical history, social, surgical and family history all reviewed in electronic medical record.  No pertanent information unless stated regarding to the chief complaint.   Past Medical History:  Diagnosis Date   Abnormal pap    ASC-US    History of HPV infection    Vulvar lesion     No Known Allergies   Review of Systems:  No headache, visual changes, nausea, vomiting, diarrhea, constipation, dizziness, abdominal pain, skin rash, fevers, chills, night sweats, weight loss, swollen lymph nodes, body aches, joint swelling, chest pain, shortness of breath, mood changes. POSITIVE muscle aches  Objective  unknown if currently breastfeeding.   General: No apparent distress alert and oriented x3 mood and affect normal, dressed appropriately.  HEENT: Pupils equal, extraocular movements intact  Respiratory: Patient's  speak in full sentences and does not appear short of breath  Cardiovascular: No lower extremity edema, non tender, no erythema  Gait MSK: Foot exam shows Back tightness noted in the paraspinal musculature in the parascapular region right greater than left.  Patient does have some limited sidebending of the neck right greater than left.  Osteopathic findings  C2 flexed rotated and side bent right C6 flexed rotated and side bent left T3 extended rotated and side bent right inhaled rib T9 extended rotated and side bent left L2 flexed rotated and side bent right Sacrum right on right       Assessment and Plan:  No problem-specific Assessment & Plan notes found for this encounter.    Nonallopathic problems  Decision today to treat with OMT was based on Physical Exam  After verbal consent patient was treated with HVLA, ME, FPR techniques in cervical, rib, thoracic, lumbar, and sacral  areas  Patient tolerated the procedure well with improvement in symptoms  Patient given exercises, stretches and lifestyle modifications  See medications in patient instructions if given  Patient will follow up in 4-8 weeks    The above documentation has been reviewed and is accurate and complete Lyndal Pulley, DO          Note: This dictation was prepared with Dragon dictation along with smaller phrase technology. Any transcriptional errors that result from this process are unintentional.

## 2022-10-06 ENCOUNTER — Encounter: Payer: Self-pay | Admitting: Family Medicine

## 2022-10-06 ENCOUNTER — Ambulatory Visit: Payer: Self-pay

## 2022-10-06 ENCOUNTER — Ambulatory Visit: Payer: BC Managed Care – PPO | Admitting: Family Medicine

## 2022-10-06 VITALS — BP 112/70 | HR 55 | Ht 64.0 in | Wt 150.0 lb

## 2022-10-06 DIAGNOSIS — M9901 Segmental and somatic dysfunction of cervical region: Secondary | ICD-10-CM

## 2022-10-06 DIAGNOSIS — M9904 Segmental and somatic dysfunction of sacral region: Secondary | ICD-10-CM

## 2022-10-06 DIAGNOSIS — M79671 Pain in right foot: Secondary | ICD-10-CM | POA: Diagnosis not present

## 2022-10-06 DIAGNOSIS — M9908 Segmental and somatic dysfunction of rib cage: Secondary | ICD-10-CM | POA: Diagnosis not present

## 2022-10-06 DIAGNOSIS — M79672 Pain in left foot: Secondary | ICD-10-CM | POA: Diagnosis not present

## 2022-10-06 DIAGNOSIS — M25572 Pain in left ankle and joints of left foot: Secondary | ICD-10-CM

## 2022-10-06 DIAGNOSIS — M9903 Segmental and somatic dysfunction of lumbar region: Secondary | ICD-10-CM

## 2022-10-06 DIAGNOSIS — M9902 Segmental and somatic dysfunction of thoracic region: Secondary | ICD-10-CM

## 2022-10-06 DIAGNOSIS — G2589 Other specified extrapyramidal and movement disorders: Secondary | ICD-10-CM | POA: Diagnosis not present

## 2022-10-06 NOTE — Assessment & Plan Note (Signed)
Does have more of a fat pad irritation.  He does like to walk around barefoot.  Discussed wearing shoes on a more regular basis.  Discussed which activities to do and which ones to avoid.  Follow-up again in 6 to 8 weeks

## 2022-10-06 NOTE — Assessment & Plan Note (Signed)
Continues to have some scapular dyskinesis.  Discussed icing regimen and home exercises, which activities to do and returns to exam.  Increase activity slowly.  Follow-up again in 6 to 8 weeks

## 2022-10-06 NOTE — Assessment & Plan Note (Signed)
Completely resolved at this time.  No other significant changes.

## 2022-10-06 NOTE — Patient Instructions (Signed)
Good to see you Try to avoid being barefoot See me in 8 weeks

## 2022-11-03 NOTE — L&D Delivery Note (Signed)
      Delivery Note:   G2P1001 at [redacted]w[redacted]d  Admitting diagnosis: Normal labor [O80, Z37.9] Risks: Declined GBS screening  First Stage:  Induction of labor:NA Onset of labor: 0330 10/13/2023  Augmentation: N/A ROM: spontaneous at delivery, clear fluid Active labor onset: 1730 10/13/2023  Hydrotherapy: tub time 2056, water temp 97.6 Analgesia /Anesthesia/Pain control intrapartum: None  Second Stage:  Complete dilation at   2105 Onset of pushing at 2105 FHR second stage cat 1   Pushing in recumbent position in birth tub with CNM and L&D staff support, partner Mosetta Putt present for birth and supportive. Nuchal Cord: No  Delivery of a Live born female  Birth Weight:  pending APGAR: 9, 9  Newborn Delivery   Birth date/time: 10/13/2023 21:11:00 Delivery type: Vaginal, Spontaneous     in cephalic presentation, position OA to LOT. Shoulders and body delivered with ease under water then baby brought out of water to mother's arms. Newborn with spontaneous cry, vigorous tone, held in mother's arms and bonding.  Cord double clamped after cessation of pulsation, cut by Narada.  Collection of cord blood for typing completed. Cord blood donation-None Arterial cord blood sample-No   Third Stage: Patient moved to bed for placenta delivery Placenta delivered 10/13/2023 at 2119-Spontaneous with 3 vessels. Uterine tone firm with intermittent atony, resolved with massage and Pitocin IM, bleeding small thereafter. Uterotonics: Pitocin 10 units IM Placenta to L&D for disposal.  2nd degree;Perineal laceration identified.  Episiotomy:None Local analgesia: 1% lido  Repair:2.0 vicryl in standard fashion, good hemostasis and approximation Est. Blood Loss (mL):300 Complications: None   Mom to postpartum.  Baby Lea to Couplet care / Skin to Skin.  Delivery Report:  Review the Delivery Report for details.     Signed: Neta Mends, CNM, MSN 10/13/2023, 9:51 PM

## 2022-11-11 NOTE — Progress Notes (Unsigned)
  [name] [place] Phone: [phone] Subjective:   Rito Ehrlich, am serving as a scribe for Dr. Hulan Saas.  I'm seeing this patient by the request  of:  Patient, No Pcp Per  CC: back and neck pain follow up   WVP:[id number]  Kassi Esteve is a 37 y.o. female coming in with complaint of back and neck pain. OMT 10/06/2022. Also f/u for L foot pain. Patient states Here for routine OMT and the Left foot is better.   Medications patient has been prescribed: None  Taking:         Reviewed prior external information including notes and imaging from previsou exam, outside providers and external EMR if available.   As well as notes that were available from care everywhere and other healthcare systems.  Past medical history, social, surgical and family history all reviewed in electronic medical record.  No pertanent information unless stated regarding to the chief complaint.   Past Medical History:  Diagnosis Date   Abnormal pap    ASC-US    History of HPV infection    Vulvar lesion     No Known Allergies   Review of Systems:  No headache, visual changes, nausea, vomiting, diarrhea, constipation, dizziness, abdominal pain, skin rash, fevers, chills, night sweats, weight loss, swollen lymph nodes, body aches, joint swelling, chest pain, shortness of breath, mood changes. POSITIVE muscle aches  Objective  Blood pressure 100/70, pulse 68, height '5\' 4"'$  (1.626 m), weight 147 lb (66.7 kg), SpO2 98 %, unknown if currently breastfeeding.   General: No apparent distress alert and oriented x3 mood and affect normal, dressed appropriately.  HEENT: Pupils equal, extraocular movements intact  Respiratory: Patient's speak in full sentences and does not appear short of breath  Cardiovascular: No lower extremity edema, non tender, no erythema   Low back exam does have some loss of lordosis.  Some tenderness to palpation  noted.  Patient does have tightness also noted in the parascapular region as well.  Osteopathic findings  C2 flexed rotated and side bent right C6 flexed rotated and side bent left T3 extended rotated and side bent right inhaled rib T9 extended rotated and side bent left L2 flexed rotated and side bent right Sacrum right on right       Assessment and Plan:  Scapular dyskinesis Scapular dyskinesis, discussed HEP patient does have tightness noted and continue to work on posture.  Patient is lifting on a more regular basis.  Discussed home exercises and icing regimen.  Follow-up again in 6 to 8 weeks    Nonallopathic problems  Decision today to treat with OMT was based on Physical Exam  After verbal consent patient was treated with HVLA, ME, FPR techniques in cervical, rib, thoracic, lumbar, and sacral  areas  Patient tolerated the procedure well with improvement in symptoms  Patient given exercises, stretches and lifestyle modifications  See medications in patient instructions if given  Patient will follow up in 4-8 weeks    The above documentation has been reviewed and is accurate and complete Lyndal Pulley, DO          Note: This dictation was prepared with Dragon dictation along with smaller phrase technology. Any transcriptional errors that result from this process are unintentional.

## 2022-11-17 ENCOUNTER — Ambulatory Visit (INDEPENDENT_AMBULATORY_CARE_PROVIDER_SITE_OTHER): Payer: BC Managed Care – PPO | Admitting: Family Medicine

## 2022-11-17 VITALS — BP 100/70 | HR 68 | Ht 64.0 in | Wt 147.0 lb

## 2022-11-17 DIAGNOSIS — M9901 Segmental and somatic dysfunction of cervical region: Secondary | ICD-10-CM

## 2022-11-17 DIAGNOSIS — M9903 Segmental and somatic dysfunction of lumbar region: Secondary | ICD-10-CM | POA: Diagnosis not present

## 2022-11-17 DIAGNOSIS — M9904 Segmental and somatic dysfunction of sacral region: Secondary | ICD-10-CM

## 2022-11-17 DIAGNOSIS — G2589 Other specified extrapyramidal and movement disorders: Secondary | ICD-10-CM | POA: Diagnosis not present

## 2022-11-17 DIAGNOSIS — M9902 Segmental and somatic dysfunction of thoracic region: Secondary | ICD-10-CM | POA: Diagnosis not present

## 2022-11-17 DIAGNOSIS — M9908 Segmental and somatic dysfunction of rib cage: Secondary | ICD-10-CM | POA: Diagnosis not present

## 2022-11-17 NOTE — Patient Instructions (Signed)
Good to see you See me in 2 months 

## 2022-11-17 NOTE — Assessment & Plan Note (Signed)
Scapular dyskinesis, discussed HEP patient does have tightness noted and continue to work on posture.  Patient is lifting on a more regular basis.  Discussed home exercises and icing regimen.  Follow-up again in 6 to 8 weeks

## 2022-12-16 ENCOUNTER — Encounter: Payer: Self-pay | Admitting: Family Medicine

## 2023-01-12 ENCOUNTER — Ambulatory Visit: Payer: BC Managed Care – PPO | Admitting: Family Medicine

## 2023-10-13 ENCOUNTER — Other Ambulatory Visit: Payer: Self-pay

## 2023-10-13 ENCOUNTER — Inpatient Hospital Stay (HOSPITAL_COMMUNITY)
Admission: AD | Admit: 2023-10-13 | Discharge: 2023-10-13 | Disposition: A | Discharge status: Home / Self Care | Payer: BC Managed Care – PPO | Source: Home / Self Care | Attending: Obstetrics | Admitting: Obstetrics

## 2023-10-13 ENCOUNTER — Encounter (HOSPITAL_COMMUNITY): Payer: Self-pay | Admitting: *Deleted

## 2023-10-13 ENCOUNTER — Inpatient Hospital Stay (HOSPITAL_COMMUNITY)
Admission: AD | Admit: 2023-10-13 | Discharge: 2023-10-15 | DRG: 807 | Disposition: A | Discharge status: Home / Self Care | Payer: BC Managed Care – PPO | Attending: Obstetrics | Admitting: Obstetrics

## 2023-10-13 DIAGNOSIS — Z8249 Family history of ischemic heart disease and other diseases of the circulatory system: Secondary | ICD-10-CM

## 2023-10-13 DIAGNOSIS — Z83438 Family history of other disorder of lipoprotein metabolism and other lipidemia: Secondary | ICD-10-CM

## 2023-10-13 DIAGNOSIS — O471 False labor at or after 37 completed weeks of gestation: Secondary | ICD-10-CM | POA: Insufficient documentation

## 2023-10-13 DIAGNOSIS — O09523 Supervision of elderly multigravida, third trimester: Secondary | ICD-10-CM | POA: Insufficient documentation

## 2023-10-13 DIAGNOSIS — Z3A40 40 weeks gestation of pregnancy: Secondary | ICD-10-CM

## 2023-10-13 DIAGNOSIS — O480 Post-term pregnancy: Secondary | ICD-10-CM | POA: Insufficient documentation

## 2023-10-13 DIAGNOSIS — O26893 Other specified pregnancy related conditions, third trimester: Secondary | ICD-10-CM | POA: Diagnosis present

## 2023-10-13 LAB — CBC
HCT: 39.1 % (ref 36.0–46.0)
Hemoglobin: 13.6 g/dL (ref 12.0–15.0)
MCH: 30.2 pg (ref 26.0–34.0)
MCHC: 34.8 g/dL (ref 30.0–36.0)
MCV: 86.9 fL (ref 80.0–100.0)
Platelets: 172 10*3/uL (ref 150–400)
RBC: 4.5 MIL/uL (ref 3.87–5.11)
RDW: 13.2 % (ref 11.5–15.5)
WBC: 19.7 10*3/uL — ABNORMAL HIGH (ref 4.0–10.5)
nRBC: 0 % (ref 0.0–0.2)

## 2023-10-13 LAB — TYPE AND SCREEN
ABO/RH(D): B POS
Antibody Screen: NEGATIVE

## 2023-10-13 MED ORDER — OXYTOCIN-SODIUM CHLORIDE 30-0.9 UT/500ML-% IV SOLN: 2.5000 [IU]/h | INTRAVENOUS | Status: DC

## 2023-10-13 MED ORDER — HYDROXYZINE HCL 50 MG PO TABS: 50.0000 mg | ORAL_TABLET | Freq: Four times a day (QID) | ORAL | Status: DC | PRN

## 2023-10-13 MED ORDER — OXYTOCIN 10 UNIT/ML IJ SOLN: 10.0000 [IU] | Freq: Once | INTRAMUSCULAR | Status: AC

## 2023-10-13 MED ORDER — OXYTOCIN 10 UNIT/ML IJ SOLN
INTRAMUSCULAR | Status: AC
  Administered 2023-10-13: 10 [IU] via INTRAMUSCULAR
  Filled 2023-10-13: qty 1

## 2023-10-13 MED ORDER — SODIUM CHLORIDE 0.9% FLUSH: 3.0000 mL | Freq: Two times a day (BID) | INTRAVENOUS | Status: DC

## 2023-10-13 MED ORDER — OXYTOCIN BOLUS FROM INFUSION: 333.0000 mL | Freq: Once | INTRAVENOUS | Status: DC

## 2023-10-13 MED ORDER — LACTATED RINGERS IV SOLN: INTRAVENOUS | Status: DC

## 2023-10-13 MED ORDER — SODIUM CHLORIDE 0.9% FLUSH: 3.0000 mL | INTRAVENOUS | Status: DC | PRN

## 2023-10-13 MED ORDER — SOD CITRATE-CITRIC ACID 500-334 MG/5ML PO SOLN: 30.0000 mL | ORAL | Status: DC | PRN

## 2023-10-13 MED ORDER — SODIUM CHLORIDE 0.9 % IV SOLN: 250.0000 mL | INTRAVENOUS | Status: DC | PRN

## 2023-10-13 MED ORDER — ACETAMINOPHEN 325 MG PO TABS: 650.0000 mg | ORAL_TABLET | ORAL | Status: DC | PRN

## 2023-10-13 MED ORDER — LIDOCAINE HCL (PF) 1 % IJ SOLN
30.0000 mL | INTRAMUSCULAR | Status: AC | PRN
  Administered 2023-10-13: 30 mL via SUBCUTANEOUS
  Filled 2023-10-13: qty 30

## 2023-10-13 MED ORDER — ONDANSETRON HCL 4 MG/2ML IJ SOLN: 4.0000 mg | Freq: Four times a day (QID) | INTRAMUSCULAR | Status: DC | PRN

## 2023-10-13 MED ORDER — LACTATED RINGERS IV SOLN: 500.0000 mL | INTRAVENOUS | Status: DC | PRN

## 2023-10-13 NOTE — MAU Note (Signed)
.  Kelsey [name] is a 37 y.o. at [redacted]w[redacted]d here in MAU reporting stronger ctxs since she left MAU at about 1700. Some bloody show but denies LOF. Reports good FM. Desires water birth  Onset of complaint: 1700 Pain score: 9 Vitals:   10/13/23 2001  Pulse: 77  Resp: 20  Temp: 97.7 F (36.5 C)  SpO2: 99%     FHT:180 Lab orders placed from triage: labor eval

## 2023-10-13 NOTE — H&P (Signed)
OB ADMISSION/ HISTORY & PHYSICAL:  Admission Date: 10/13/2023  7:54 PM  Admit Diagnosis: Normal labor [O80, Z37.9]    Kelsey [name] is a 37 y.o. female presenting for active labor. Seen earlier today for labor triage, was 4/80/-2, latency, no change over 2 hours and opted to return home and await active labor. Reports ctx more intense after returning home, now difficulty coping with ctx. + FM, no LOF, + show.  Plans unmedicated waterbirth, spouse Mosetta Putt present and supportive. Expecting a baby girl.  Prenatal History: G2P1001   EDC : 10/11/2023, per 1 T sono Prenatal care [facility]   with onset of care at 10 weeks Primary D. Renae Fickle, CNM  Pregnancy complications or risk:declines GBS screening, risks of GBS sepsis discussed and recc testing, patient declined multiple times.    Prenatal labs: ABO, Rh:  B pos Antibody:  neg Rubella:  imm RPR:   neg HBsAg:   neg HIV:   neg GBS:   declined testing 1 hr Glucola 97            Genetic screening Panorama LR XX Ultrasound: [id number] Result normal XX anatomy, posterior placenta, AGA 80%  Medical / Surgical History :  Past medical history:  Past Medical History:  Diagnosis Date   Abnormal pap    ASC-US    History of HPV infection    Vulvar lesion      Past surgical history:  Past Surgical History:  Procedure Laterality Date   KNEE SURGERY       Family History:  Family History  Problem Relation Age of Onset   Hyperlipidemia Father    Hypertension Father    Cancer Maternal Grandfather    Arthritis Paternal Grandmother      Social History:  reports that she has never smoked. She has never used smokeless tobacco. She reports current alcohol use. She reports that she does not use drugs.  Allergies: Patient has no known allergies.   Current Medications at time of admission:  Medications Prior to Admission  Medication Sig Dispense Refill Last Dose   vitamin B-12 (CYANOCOBALAMIN) 100 MCG tablet Take  100 mcg by mouth daily.        Review of Systems: ROS As noted in HPI  Physical Exam: Vital signs and nursing notes reviewed.  Patient Vitals for the past 24 hrs:  BP Temp Pulse Resp SpO2 Height Weight  10/13/23 2006 (!) 108/57 -- -- -- -- -- --  10/13/23 2001 -- 97.7 F (36.5 C) 77 20 99 % 5\' 4"  (1.626 m) 84.8 kg     General: AAO x 3, NAD, coping with help from spouse Heart: RRR Lungs:CTAB Abdomen: Gravid, NT, Leopold's vertex Extremities: no edema Genitalia / VE:   8/100/-1 IBOW, vertex  FHR: 140 BPM, mod variability, + accels, no decels TOCO: Ctx q 3 min  Labs:   Pending T&S, CBC, RPR  No results for input(s): "WBC", "HGB", "HCT", "PLT" in the last 72 hours.   Assessment/Plan:  37 y.o. G2P1001 at [redacted]w[redacted]d  Fetal wellbeing - FHT category 1 EFW 7.5 lbs, AGA  Labor: active Admit, routine L&D orders, hydrotherapy as appropriate Expectant management Declines PIV after counseling  GBS unknown, declined testing, counseled on risks Rubella imm Rh pos  Pain control: desires hydrotherapy, class completed Analgesia/anesthesia PRN  Anticipated MOD: NSVB  Plans to breastfeed. POC discussed with patient and support team, all questions answered.  Dr D'Iorio notified of admission / plan of care  Neta Mends CNM, MSN 10/13/2023, 8:22 PM

## 2023-10-13 NOTE — MAU Note (Signed)
When leaving Triage with pt Kelsey [name] CNM greeted pt. CNM stated she was going to admit the pt to BS. Since pt had just left MAU about 1700 Kelsey [name] CNM called and spoke to [facility] RN CN on BS. Pt desires water birth so taken directly to 209 after Triage. Report given to Olivia Mackie RN at bedside upon pt's arrival to 209

## 2023-10-13 NOTE — MAU Note (Signed)
Kelsey [name] is a 37 y.o. at [redacted]w[redacted]d here in MAU reporting: she's having ctxs that are 4-7 minutes apart since 0330 this morning.  Denies VB or LOF.  Endorses +FM. LMP: NA Onset of complaint: today Pain score: 7 Vitals:   10/13/23 1455  BP: 120/74  Pulse: 77  Resp: 17  Temp: 98 F (36.7 C)  SpO2: 100%     FHT:136 bpm Lab orders placed from triage:   None

## 2023-10-13 NOTE — MAU Note (Signed)
Pt did not want to ave baby on fetal monitor. Sated they already listed to baby heart rate. Did not place EFM per patient preference. Will notifiy D. Paul.CNM.

## 2023-10-13 NOTE — Lactation Note (Signed)
This note was copied from a baby's chart. Lactation Consultation Note  Patient Name: Kelsey [name] ZOXWR'[id number] Date: 10/13/2023 Age:35 hours Reason for consult: L&D Initial assessment;Mother's request;Term;Breastfeeding assistance;RN request  P2- RN/MOB requested latching assistance in L&D. LC assisted MOB with placing infant on the left breast in the cross cradle hold. MOB struggled with supporting infant's head in this position at this time, so LC assisted with switching to the right breast in the football hold. Infant latched much easier in this position and MOB felt comfortable. LC noted that infant's bottom jaw sticks out further than her top jaw, which was causing lip tucking with every latch. LC reviewed how to be aware of this and how to flange the lips as needed. As infant was nursing on the right breast, MOB requested for [facility] to teach her hand expression on the left breast. MOB was able to reciprocate hand expression. Colostrum was easily noted. Infant was still nursing when [facility] left room.  LC reviewed 24 hr birthday nap, cluster feeding on day 2, feeding infant on cue 8-12x in 24 hrs, not allowing infant to go over 3 hrs without a feeding, CDC milk storage guidelines and LC services handout. LC encouraged MOB to call for further assistance as needed.  Maternal Data Has patient been taught Hand Expression?: Yes Does the patient have breastfeeding experience prior to this delivery?: Yes How long did the patient breastfeed?: 2.5 years  Feeding Mother's Current Feeding Choice: Breast Milk  LATCH Score Latch: Repeated attempts needed to sustain latch, nipple held in mouth throughout feeding, stimulation needed to elicit sucking reflex.  Audible Swallowing: A few with stimulation  Type of Nipple: Everted at rest and after stimulation  Comfort (Breast/Nipple): Soft / non-tender  Hold (Positioning): Assistance needed to correctly position infant at breast and maintain latch.  LATCH  Score: 7   Lactation Tools Discussed/Used Pump Education: Milk Storage  Interventions Interventions: Breast feeding basics reviewed;Assisted with latch;Hand express;Breast compression;Adjust position;Support pillows;Position options;Expressed milk;Education;LC Services brochure  Discharge Discharge Education: Warning signs for feeding baby Pump: DEBP;Personal  Consult Status Consult Status: Follow-up from L&D Date: 10/14/23 Follow-up type: In-patient    Dema Severin BS, IBCLC 10/13/2023, 11:35 PM

## 2023-10-13 NOTE — MAU Provider Note (Cosign Needed Addendum)
Kelsey [name] is a 37 y.o. G2P1001 at [redacted]w[redacted]d presenting for labor check. Pt notes variable intensity contractions since 3:30am . Good fetal movement, small vaginal bleeding/show last void, no loss of amniotic fluid. Plans unmedicated waterbirth, spouse Kelsey [name] present and supportive.   Prenatal Course Source of Care:[facility]   with onset of care at 10 weeks Pregnancy complications or risk:none Current medications:  Medications Prior to Admission  Medication Sig Dispense Refill   vitamin B-12 (CYANOCOBALAMIN) 100 MCG tablet Take 100 mcg by mouth daily.     Vitamin D, Ergocalciferol, (DRISDOL) 50000 units CAPS capsule Take 1 capsule (50,000 Units total) by mouth every 7 (seven) days. 8 capsule 0    OB History     Gravida  2   Para  1   Term  1   Preterm      AB      Living  1      SAB      IAB      Ectopic      Multiple  0   Live Births  1          Past Medical History:  Diagnosis Date   Abnormal pap    ASC-US    History of HPV infection    Vulvar lesion    Past Surgical History:  Procedure Laterality Date   KNEE SURGERY     Family History: family history includes Arthritis in her paternal grandmother; Cancer in her maternal grandfather; Hyperlipidemia in her father; Hypertension in her father. Social History:  reports that she has never smoked. She has never used smokeless tobacco. She reports current alcohol use. She reports that she does not use drugs.  Review of Systems - Negative except contractions and small bloody show   Dilation: 4.5 Effacement (%): 80 Station: -2 Exam by:: D.Thanh Pomerleau,CNM Vertex, IBBOW  Blood pressure 120/74, pulse 77, temperature 98 F (36.7 C), temperature source Oral, resp. rate 17, height 5\' 4"  (1.626 m), weight 85.2 kg, SpO2 100%, unknown if currently breastfeeding.  Physical Exam: Blood pressure 120/74, pulse 77, temperature 98 F (36.7 C), temperature source Oral, resp. rate 17, height 5\' 4"   (1.626 m), weight 85.2 kg, SpO2 100%, unknown if currently breastfeeding. General: NAD Heart: RRR, no murmurs Lungs: CTA b/l  Abd: Soft, NT, EFW 7.5 lbs  Ext: no edema Neuro: DTRs normal  FHR:  Baseline rate 130   Variability moderate  Accelerations +  Decelerations none Contractions: Frequency irregular  Intensity mild   Prenatal labs: ABO, Rh:  B pos Antibody:  neg Rubella:  imm RPR:   neg HBsAg:   neg HIV:   neg GBS:   declined testing 1 hr Glucola 97  Genetic screening Panorama LR XX Ultrasound: [id number] Result normal XX anatomy, posterior placenta, AGA 80%  Assessment: 37 y.o. G2P1001 at [redacted]w[redacted]d  1. Labor: early, latent 2. Fetal Wellbeing: Category 1  3. Pain Control: coping well using comfort measures 4. GBS: unknown, declined all testing after counseling 5. Planning waterbirth, class completed   Plan:  1. Discussed admit and augmentation vs expectant management. Desires to walk 1-2 hrs and reassess. Declines membrane sweep.        Neta Mends, CNM, MSN 10/13/2023, 3:12 PM      Update 5:09 PM  Patient cervix unchanged and contractions irregular and mild.  FHT 140, no decels  Offered augmentation with AROM or cytotec/pitocin and declines.  Will go home for now and await active  labor.   Dr. Wendall Stade updated with POC.   Neta Mends, MSN, CNM 10/13/2023, 5:12 PM

## 2023-10-13 NOTE — MAU Note (Signed)
Pt back from walking . D.Paul,CNM called and rechecked pt . Unchanged. Pt to go home and come back when things changed.,

## 2023-10-13 NOTE — MAU Note (Signed)
Colon Flattery at bedside. Did SVE  and recommend pt to allow fetal monitoring for 20-30 min to assure fetal well being. Pt can then go walk for 1 hr and come Back to her room and be rechecked. Pt agreeable to POC.

## 2023-10-14 LAB — CBC
HCT: 36.3 % (ref 36.0–46.0)
Hemoglobin: 12.3 g/dL (ref 12.0–15.0)
MCH: 29.7 pg (ref 26.0–34.0)
MCHC: 33.9 g/dL (ref 30.0–36.0)
MCV: 87.7 fL (ref 80.0–100.0)
Platelets: 155 10*3/uL (ref 150–400)
RBC: 4.14 MIL/uL (ref 3.87–5.11)
RDW: 13.2 % (ref 11.5–15.5)
WBC: 17.5 10*3/uL — ABNORMAL HIGH (ref 4.0–10.5)
nRBC: 0 % (ref 0.0–0.2)

## 2023-10-14 LAB — RPR: RPR Ser Ql: NONREACTIVE

## 2023-10-14 MED ORDER — PRENATAL MULTIVITAMIN CH: 1.0000 | ORAL_TABLET | Freq: Every day | ORAL | Status: DC

## 2023-10-14 MED ORDER — ONDANSETRON HCL 4 MG PO TABS
4.0000 mg | ORAL_TABLET | ORAL | Status: DC | PRN
Start: 2023-10-14 — End: 2023-10-15

## 2023-10-14 MED ORDER — WITCH HAZEL-GLYCERIN EX PADS: 1.0000 | MEDICATED_PAD | CUTANEOUS | Status: DC | PRN

## 2023-10-14 MED ORDER — TETANUS-DIPHTH-ACELL PERTUSSIS 5-2.5-18.5 LF-MCG/0.5 IM SUSY: 0.5000 mL | PREFILLED_SYRINGE | Freq: Once | INTRAMUSCULAR | Status: DC

## 2023-10-14 MED ORDER — ONDANSETRON HCL 4 MG/2ML IJ SOLN
4.0000 mg | INTRAMUSCULAR | Status: DC | PRN
Start: 2023-10-14 — End: 2023-10-15

## 2023-10-14 MED ORDER — DIPHENHYDRAMINE HCL 25 MG PO CAPS: 25.0000 mg | ORAL_CAPSULE | Freq: Four times a day (QID) | ORAL | Status: DC | PRN

## 2023-10-14 MED ORDER — FLEET ENEMA RE ENEM: 1.0000 | ENEMA | Freq: Every day | RECTAL | Status: DC | PRN

## 2023-10-14 MED ORDER — SIMETHICONE 80 MG PO CHEW: 80.0000 mg | CHEWABLE_TABLET | ORAL | Status: DC | PRN

## 2023-10-14 MED ORDER — SENNOSIDES-DOCUSATE SODIUM 8.6-50 MG PO TABS: 2.0000 | ORAL_TABLET | ORAL | Status: DC

## 2023-10-14 MED ORDER — ACETAMINOPHEN 500 MG PO TABS: 1000.0000 mg | ORAL_TABLET | Freq: Four times a day (QID) | ORAL | 0 refills | Status: AC

## 2023-10-14 MED ORDER — BISACODYL 10 MG RE SUPP: 10.0000 mg | Freq: Every day | RECTAL | Status: DC | PRN

## 2023-10-14 MED ORDER — BENZOCAINE-MENTHOL 20-0.5 % EX AERO: 1.0000 | INHALATION_SPRAY | CUTANEOUS | Status: DC | PRN

## 2023-10-14 MED ORDER — ACETAMINOPHEN 500 MG PO TABS
1000.0000 mg | ORAL_TABLET | Freq: Four times a day (QID) | ORAL | Status: DC
  Filled 2023-10-14: qty 2

## 2023-10-14 MED ORDER — IBUPROFEN 600 MG PO TABS: 600.0000 mg | ORAL_TABLET | Freq: Four times a day (QID) | ORAL | 0 refills | Status: AC

## 2023-10-14 MED ORDER — ZOLPIDEM TARTRATE 5 MG PO TABS: 5.0000 mg | ORAL_TABLET | Freq: Every evening | ORAL | Status: DC | PRN

## 2023-10-14 MED ORDER — DIBUCAINE (PERIANAL) 1 % EX OINT: 1.0000 | TOPICAL_OINTMENT | CUTANEOUS | Status: DC | PRN

## 2023-10-14 MED ORDER — COCONUT OIL OIL: 1.0000 | TOPICAL_OIL | Status: DC | PRN

## 2023-10-14 MED ORDER — IBUPROFEN 600 MG PO TABS
600.0000 mg | ORAL_TABLET | Freq: Four times a day (QID) | ORAL | Status: DC
  Filled 2023-10-14: qty 1

## 2023-10-14 NOTE — Progress Notes (Signed)
Postpartum Progress Note  PPD#1 s/p SVD  S: Patient seen and examined at bedside. Reports feeling overall well. Pain well controlled, ambulating, tolerating regular diet, voiding without issue. Denies fever, chills, chest pain, shortness of breath.  Feeding: plans to breastfeed Circ: N/A - female neonate  O:  Vitals:   10/14/23 0442 10/14/23 0902  BP: 114/67 111/70  Pulse: 67 71  Resp: 18   Temp: 98.4 F (36.9 C) 98.1 F (36.7 C)  SpO2: 97%     PE:  GA: well appearing, NAD CV: RRR, normal S1, S2 Lungs: CTAB Abd: soft, appropriately tender, fundus firm below umbilicus Peri: moderate lochia Ext: no TTP, +1 non-pitting edema  Labs:  Lab Results  Component Value Date   WBC 17.5 (H) 10/14/2023   HGB 12.3 10/14/2023   HCT 36.3 10/14/2023   MCV 87.7 10/14/2023   PLT 155 10/14/2023   No results found for: "CREATININE"  A/P:  37 y.o.yo [id number] PPD#1 s/p SVD (EBL ), doing well and progressing appropriately. Vitals within normal limits. Physical exam benign. Labs normal. Plan as follows:   #Routine OB - Regular diet, HLIV - ERAS for pain control  - Rh positive - DVT ppx: ambulating - BCM: Unsure  #Neonate -plans to breastfeed   Desires discharge tonight if possible. Will prepare maternal discharge, d/w pt will depend on pediatrician and baby.   Marlene Bast, MD

## 2023-10-14 NOTE — Lactation Note (Signed)
This note was copied from a baby's chart. Lactation Consultation Note  Patient Name: Kelsey [name] [id number] Date: 10/14/2023 Age:35 hours Reason for consult: Follow-up assessment;Mother's request;Term;Nipple pain/trauma;Breastfeeding assistance  P2- MOB requested with latching assistance due to painful nipples. Last night when LC initially saw this couplet, infant was chomping more than actively nursing. At this time, infant is still chomping more than actively nursing. LC reassured MOB that this will resolve, but it may take a few days. LC provided MOB with advice on how to care for her nipples until it resolves. LC provided MOB with a nipple shield just in case, a curved tip syringe and a colostrum collector. MOB verbalized relief that it is not her latching skills that is the issue. LC encouraged MOB to follow up with her outpatient LC and to call our lactation team for further assistance as needed.  Maternal Data Has patient been taught Hand Expression?: Yes Does the patient have breastfeeding experience prior to this delivery?: Yes  Feeding Mother's Current Feeding Choice: Breast Milk  LATCH Score Latch: Grasps breast easily, tongue down, lips flanged, rhythmical sucking.  Audible Swallowing: Spontaneous and intermittent  Type of Nipple: Everted at rest and after stimulation  Comfort (Breast/Nipple): Filling, red/small blisters or bruises, mild/mod discomfort  Hold (Positioning): No assistance needed to correctly position infant at breast.  LATCH Score: 9   Lactation Tools Discussed/Used Flange Size: 18 Breast pump type: Manual Pump Education: Setup, frequency, and cleaning;Milk Storage Pumping frequency: as needed  Interventions Interventions: Breast feeding basics reviewed;Education;LC Services brochure  Discharge Discharge Education: Warning signs for feeding baby Pump: DEBP;Personal  Consult Status Consult Status: Complete Date: 10/15/23 Follow-up type:  In-patient    Dema Severin BS, IBCLC 10/14/2023, 9:35 PM

## 2023-10-14 NOTE — Discharge Summary (Signed)
Postpartum Discharge Summary   Patient Name: Kelsey [name] DOB: [birth year] MRN: [id number]  Date of admission: 10/13/2023 Delivery date:10/13/2023 Delivering provider: Neta Mends Date of discharge: 10/14/2023  Admitting diagnosis: Normal labor [O80, Z37.9] Intrauterine pregnancy: [redacted]w[redacted]d     Secondary diagnosis:  Principal Problem:   Postpartum care following vaginal delivery - 12/10 Active Problems:   SVD (spontaneous vaginal delivery)   Normal labor   Perineal laceration, second degree  Additional problems: None    Discharge diagnosis: Term Pregnancy Delivered                                              Post partum procedures: None Augmentation: N/A Complications: None  Hospital course: Onset of Labor With Vaginal Delivery      37 y.o. yo [id number] at [redacted]w[redacted]d was admitted in Active Labor on 10/13/2023. Labor course was uncomplicated. Membrane Rupture Time/Date:  ,   Delivery Method:Vaginal, Spontaneous Operative Delivery:N/A Episiotomy: None Lacerations:  2nd degree;Perineal Patient had an uncomplicated postpartum course.  She is ambulating, tolerating a regular diet, passing flatus, and urinating well. Patient is discharged home in stable condition on 10/14/23.  Newborn Data: Birth date:10/13/2023 Birth time:9:11 PM Gender:Female Living status:Living Apgars:9 ,9  Weight:3260 g  Magnesium Sulfate received: No BMZ received: No Rhophylac:No MMR:No T-DaP: Declines Flu: No RSV Vaccine received: No Transfusion:No Immunizations administered: There is no immunization history for the selected administration types on file for this patient.  Physical exam  Vitals:   10/13/23 2330 10/14/23 0030 10/14/23 0442 10/14/23 0902  BP: (!) 113/53 (!) 124/53 114/67 111/70  Pulse: 77 65 67 71  Resp: 18 18 18    Temp: 99.6 F (37.6 C) 99.2 F (37.3 C) 98.4 F (36.9 C) 98.1 F (36.7 C)  TempSrc: Oral Oral Oral Oral  SpO2: 99% 99% 97%   Weight:      Height:        General: alert, cooperative, and no distress Lochia: appropriate Uterine Fundus: firm Incision: N/A DVT Evaluation: No evidence of DVT seen on physical exam. Labs: Lab Results  Component Value Date   WBC 17.5 (H) 10/14/2023   HGB 12.3 10/14/2023   HCT 36.3 10/14/2023   MCV 87.7 10/14/2023   PLT 155 10/14/2023       No data to display         Edinburgh Score:     No data to display            After visit meds:  Allergies as of 10/14/2023   No Known Allergies      Medication List     TAKE these medications    acetaminophen 500 MG tablet Commonly known as: TYLENOL Take 2 tablets (1,000 mg total) by mouth every 6 (six) hours.   ibuprofen 600 MG tablet Commonly known as: ADVIL Take 1 tablet (600 mg total) by mouth every 6 (six) hours.   vitamin B-12 100 MCG tablet Commonly known as: CYANOCOBALAMIN Take 100 mcg by mouth daily.         Discharge home in stable condition Infant Feeding: Breast Infant Disposition:home with mother Discharge instruction: per After Visit Summary and Postpartum booklet. Activity: Advance as tolerated. Pelvic rest for 6 weeks.  Diet: routine diet Anticipated Birth Control: Unsure Postpartum Appointment:6 weeks Additional Postpartum F/U: None Future Appointments:No future appointments. Follow up Visit:  Follow-up Information  Neta Mends, CNM. Schedule an appointment as soon as possible for a visit in 6 week(s).   Specialty: Obstetrics and Gynecology Contact information: [address] [place] [address] [phone]                     10/14/2023 Antony Salmon D'iorio, MD   10/15/23 Addendum:  Clent Ridges PPD2 remained inpatient due to baby not being cleared for discharge yesterday (12/11). She remained inpatient and received routine postpartum care. This morning she is doing well meeting all postpartum milestones with light lochia, pain well controlled, ambulating, voiding, and tolerating regular  diet. Working on breastfeeding and waiting for baby discharge. Routine PP discharge instructions reviewed and plan for 6 week postpartum follow up.  Cassandra A Law 10/15/23 9:00 AM

## 2023-10-14 NOTE — Discharge Instructions (Signed)
Congratulations! We hope you have a wonderful postpartum period. We will plan to see you in the office in 6 weeks. Please call the office if you experience fevers (>100.4 F), heavy vaginal bleeding (using >2 pads/hour), severe pain not responsive to ibuprofen (Advil or Motrin) and acetaminophen (Tylenol), chest pain, shortness of breath, or if you have pain, redness, or swelling in one or both legs. Mood swings, fatigue, and feeling down can be normal in the first two weeks following birth. If you feel your mood symptoms are severe or lasting longer than two weeks, please call our office. If you have any thoughts of hurting yourself or others please call our office. Please call your pediatrician if you have any concerns about your baby.

## 2023-10-14 NOTE — Lactation Note (Signed)
This note was copied from a baby's chart. Lactation Consultation Note  Patient Name: Kelsey [name] Date: 10/14/2023 Age:35 hours Reason for consult: Initial assessment;Term;Infant weight loss;Breastfeeding assistance, MOB requested LC visit.  As LC entered the room the baby was latched on the right breast with depth, flanged lips and swallows. Per mom some pinching noted.  Baby finished after the right breast, LC changed a large wet and assisted to latch on the left breast, football, and worked on proper positioning , pillow support and depth. Swallows noted and per mom comfortable.  Per mom her LC she saw perinatally measured her for flanges and told her #17 F was her size. LC provided a hand pump , rechecked the size and #18 F fits well . LC mentioned to mom depended how full she is will change the sizing of a needed flange when the milk comes in.     Maternal Data Has patient been taught Hand Expression?: Yes Does the patient have breastfeeding experience prior to this delivery?: Yes  Feeding Mother's Current Feeding Choice: Breast Milk  LATCH Score Latch: Grasps breast easily, tongue down, lips flanged, rhythmical sucking.  Audible Swallowing: A few with stimulation  Type of Nipple: Everted at rest and after stimulation  Comfort (Breast/Nipple): Soft / non-tender  Hold (Positioning): Assistance needed to correctly position infant at breast and maintain latch.  LATCH Score: 8   Lactation Tools Discussed/Used Tools: Shells;Pump;Flanges (Not needed for  latching - just preparing for D/C tomorrow) Flange Size: 18;21 (#18 F fit the best) Breast pump type: Manual Pump Education: Setup, frequency, and cleaning;Milk Storage  Interventions Interventions: Breast feeding basics reviewed;Assisted with latch;Skin to skin;Breast massage;Hand express;Breast compression;Reverse pressure;Adjust position;Support pillows;Position options;Shells;Hand pump;Education;CDC  Guidelines for Breast Pump Cleaning  Discharge Pump: Personal;Manual;DEBP (per mom Medela) WIC Program: No  Consult Status Consult Status: Follow-up Date: 10/15/23 Follow-up type: In-patient    Kelsey [name] 10/14/2023, 9:05 AM

## 2023-10-15 NOTE — Lactation Note (Signed)
This note was copied from a baby's chart. Lactation Consultation Note  Patient Name: Kelsey [name] [id number] Date: 10/15/2023 Age:35 hours  Reason for consult: Follow-up assessment;Mother's request;Breastfeeding assistance;Term  P2, [redacted]w[redacted]d., 6.60 % weight loss  Mother requested LC to observe infant feeding. Mother demonstrates ease and confidence when latching baby Kelsey "[name]". Baby latches with wide gape and deep onto the breast. Baby suckles with occasional swallows observed. She does appear to "chomp' at times and mother's nipples look pinched. Mother reports she is feeding baby frequently and her nipples are sore. She is able to express and apply her own colostrum to her nipples. Mother is also using a nipple butter brought from home that she prefers to use. Mother reports positive breast changes during pregnancy, she has expressed 2 ml of colostrum and fed to baby.   Mother is having concerns that baby is not getting enough from the breast. She does not want to supplement with donor breast milk or formula. Mother/baby are being discharged.   Advised to pump following the feeding every 3 hours for 15 minutes and feed baby expressed milk. Feed baby with cues and  skin to skin. Watch for signs of good hydration with voids and stools. Contact Pediatrician with concerns. Baby has an appointment with Ped tomorrow and OP lactation consultant appointment.  Mom made aware of O/P services, breastfeeding support groups,  and our phone # for post-discharge questions.     Feeding Mother's Current Feeding Choice: Breast Milk  LATCH Score Latch: Grasps breast easily, tongue down, lips flanged, rhythmical sucking.  Audible Swallowing: A few with stimulation  Type of Nipple: Everted at rest and after stimulation  Comfort (Breast/Nipple): Soft / non-tender  Hold (Positioning): No assistance needed to correctly position infant at breast.  LATCH Score: 9   Lactation Tools  Discussed/Used Breast pump type: Manual Reason for Pumping: stimulate milk production and provided supplement  Interventions Interventions: Breast feeding basics reviewed;Breast compression;Hand express;Education  Discharge Discharge Education: Engorgement and breast care;Warning signs for feeding baby;Other (comment) (mother has a Writer arranged to come to her home tomorrow) Pump: Hands Free;Manual  Consult Status Consult Status: Complete Date: 10/15/23    Omar Person 10/15/2023, 10:06 AM

## 2023-10-21 ENCOUNTER — Telehealth (HOSPITAL_COMMUNITY): Payer: Self-pay | Admitting: *Deleted

## 2023-10-21 NOTE — Telephone Encounter (Signed)
10/21/2023  Name: Adreanna Mans MRN: [id number] DOB: [birth year]  Reason for Call:  Transition of Care Hospital Discharge Call  Contact Status: Patient Contact Status: Complete  Language assistant needed: Interpreter Mode: Interpreter Not Needed        Follow-Up Questions: Do You Have Any Concerns About Your Health As You Heal From Delivery?: No Do You Have Any Concerns About Your Infants Health?: No  Edinburgh Postnatal Depression Scale:  In the Past 7 Days: I have been able to laugh and see the funny side of things.: As much as I always could I have looked forward with enjoyment to things.: As much as I ever did I have blamed myself unnecessarily when things went wrong.: No, never I have been anxious or worried for no good reason.: No, not at all I have felt scared or panicky for no good reason.: No, not at all Things have been getting on top of me.: No, I have been coping as well as ever I have been so unhappy that I have had difficulty sleeping.: Not at all I have felt sad or miserable.: No, not at all I have been so unhappy that I have been crying.: No, never The thought of harming myself has occurred to me.: Never Inocente Salles Postnatal Depression Scale Total: 0  PHQ2-9 Depression Scale:     Discharge Follow-up: Edinburgh score requires follow up?: No Patient was advised of the following resources:: Support Group, Breastfeeding Support Group (declines postpartum group information via email)  Post-discharge interventions: Reviewed Newborn Safe Sleep Practices  Salena Saner, RN 10/21/2023 13:56
